# Patient Record
Sex: Female | Born: 1945
Health system: Southern US, Community
[De-identification: ages and names within clinical notes are randomized; demographics above are authoritative.]

## PROBLEM LIST (undated history)

## (undated) DIAGNOSIS — E559 Vitamin D deficiency, unspecified: Secondary | ICD-10-CM

## (undated) DIAGNOSIS — B019 Varicella without complication: Secondary | ICD-10-CM

## (undated) DIAGNOSIS — N390 Urinary tract infection, site not specified: Secondary | ICD-10-CM

## (undated) DIAGNOSIS — F339 Major depressive disorder, recurrent, unspecified: Secondary | ICD-10-CM

## (undated) DIAGNOSIS — F419 Anxiety disorder, unspecified: Secondary | ICD-10-CM

## (undated) DIAGNOSIS — Z9289 Personal history of other medical treatment: Secondary | ICD-10-CM

## (undated) DIAGNOSIS — R51 Headache: Secondary | ICD-10-CM

## (undated) DIAGNOSIS — R739 Hyperglycemia, unspecified: Secondary | ICD-10-CM

## (undated) DIAGNOSIS — K635 Polyp of colon: Secondary | ICD-10-CM

## (undated) HISTORY — DX: Polyp of colon: K63.5

## (undated) HISTORY — DX: Varicella without complication: B01.9

## (undated) HISTORY — PX: BREAST IMPLANT REMOVAL: SUR1101

## (undated) HISTORY — DX: Personal history of other medical treatment: Z92.89

## (undated) HISTORY — DX: Headache: R51

## (undated) HISTORY — PX: AUGMENTATION MAMMAPLASTY: SUR837

## (undated) HISTORY — DX: Major depressive disorder, recurrent, unspecified: F33.9

## (undated) HISTORY — PX: EXCISION MORTON'S NEUROMA: SHX5013

## (undated) HISTORY — DX: Vitamin D deficiency, unspecified: E55.9

## (undated) HISTORY — DX: Urinary tract infection, site not specified: N39.0

## (undated) HISTORY — DX: Anxiety disorder, unspecified: F41.9

## (undated) HISTORY — DX: Hyperglycemia, unspecified: R73.9

## (undated) HISTORY — PX: BUNIONECTOMY: SHX129

## (undated) HISTORY — PX: BREAST ENHANCEMENT SURGERY: SHX7

---

## 1999-01-24 ENCOUNTER — Other Ambulatory Visit: Admission: RE | Admit: 1999-01-24 | Discharge: 1999-01-24 | Payer: Self-pay | Admitting: Obstetrics & Gynecology

## 1999-11-28 HISTORY — PX: TOTAL ABDOMINAL HYSTERECTOMY: SHX209

## 2000-03-26 ENCOUNTER — Other Ambulatory Visit: Admission: RE | Admit: 2000-03-26 | Discharge: 2000-03-26 | Payer: Self-pay | Admitting: Obstetrics & Gynecology

## 2001-04-15 ENCOUNTER — Other Ambulatory Visit: Admission: RE | Admit: 2001-04-15 | Discharge: 2001-04-15 | Payer: Self-pay | Admitting: Obstetrics & Gynecology

## 2002-09-23 ENCOUNTER — Other Ambulatory Visit: Admission: RE | Admit: 2002-09-23 | Discharge: 2002-09-23 | Payer: Self-pay | Admitting: Obstetrics & Gynecology

## 2002-10-21 ENCOUNTER — Other Ambulatory Visit: Admission: RE | Admit: 2002-10-21 | Discharge: 2002-10-21 | Payer: Self-pay | Admitting: Obstetrics & Gynecology

## 2003-03-30 ENCOUNTER — Other Ambulatory Visit: Admission: RE | Admit: 2003-03-30 | Discharge: 2003-03-30 | Payer: Self-pay | Admitting: Obstetrics & Gynecology

## 2003-07-06 ENCOUNTER — Other Ambulatory Visit: Admission: RE | Admit: 2003-07-06 | Discharge: 2003-07-06 | Payer: Self-pay | Admitting: Obstetrics & Gynecology

## 2004-01-07 ENCOUNTER — Other Ambulatory Visit: Admission: RE | Admit: 2004-01-07 | Discharge: 2004-01-07 | Payer: Self-pay | Admitting: Obstetrics & Gynecology

## 2005-03-23 ENCOUNTER — Other Ambulatory Visit: Admission: RE | Admit: 2005-03-23 | Discharge: 2005-03-23 | Payer: Self-pay | Admitting: Obstetrics & Gynecology

## 2013-05-27 LAB — HM MAMMOGRAPHY: HM Mammogram: NORMAL

## 2013-05-27 LAB — HM PAP SMEAR: HM Pap smear: NORMAL

## 2013-10-06 ENCOUNTER — Ambulatory Visit: Payer: Self-pay | Admitting: Family Medicine

## 2013-12-17 ENCOUNTER — Ambulatory Visit (INDEPENDENT_AMBULATORY_CARE_PROVIDER_SITE_OTHER): Payer: Medicare HMO | Admitting: Family Medicine

## 2013-12-17 ENCOUNTER — Encounter: Payer: Self-pay | Admitting: Family Medicine

## 2013-12-17 VITALS — BP 110/80 | Temp 97.9°F | Ht 65.5 in | Wt 139.0 lb

## 2013-12-17 DIAGNOSIS — L309 Dermatitis, unspecified: Secondary | ICD-10-CM

## 2013-12-17 DIAGNOSIS — N951 Menopausal and female climacteric states: Secondary | ICD-10-CM

## 2013-12-17 DIAGNOSIS — L259 Unspecified contact dermatitis, unspecified cause: Secondary | ICD-10-CM

## 2013-12-17 DIAGNOSIS — R232 Flushing: Secondary | ICD-10-CM

## 2013-12-17 DIAGNOSIS — Q825 Congenital non-neoplastic nevus: Secondary | ICD-10-CM

## 2013-12-17 DIAGNOSIS — G4700 Insomnia, unspecified: Secondary | ICD-10-CM

## 2013-12-17 DIAGNOSIS — Z23 Encounter for immunization: Secondary | ICD-10-CM

## 2013-12-17 DIAGNOSIS — F411 Generalized anxiety disorder: Secondary | ICD-10-CM

## 2013-12-17 DIAGNOSIS — I781 Nevus, non-neoplastic: Secondary | ICD-10-CM

## 2013-12-17 MED ORDER — VENLAFAXINE HCL ER 37.5 MG PO CP24: 37.5000 mg | ORAL_CAPSULE | Freq: Every day | ORAL | Status: DC

## 2013-12-17 NOTE — Addendum Note (Signed)
Addended by: Colleen Can on: 12/17/2013 12:37 PM   Modules accepted: Orders

## 2013-12-17 NOTE — Patient Instructions (Signed)
-  We placed a referral for you as discussed to the dermatologist. It usually takes about 1-2 weeks to process and schedule this referral. If you have not heard from [place] regarding this appointment in 2 weeks please contact our office.  -As we discussed, we have prescribed a new medication for you at this appointment. We discussed the common and serious potential adverse effects of this medication and you can review these and more with the pharmacist when you pick up your medication.  Please follow the instructions for use carefully and notify us immediately if you have any problems taking this medication.  For Sleep: -keep room cool, dark quiet -wake up at same time every day -if tossing and turning for >15 minutes --> get out of bed, journal thoughts and do quiet activity then return to bed --> repeat as needed -wean off of the xanax -consider counseling  Follow up in 1 month

## 2013-12-17 NOTE — Progress Notes (Signed)
Pre visit review using our clinic review tool, if applicable. No additional management support is needed unless otherwise documented below in the visit note. 

## 2013-12-17 NOTE — Progress Notes (Signed)
Chief Complaint  Patient presents with  . Establish Care    HPI:  Destiny Robbins is here to establish care.  Last PCP and physical: sees Destiny Robbins at [facility] - had labs at last physical  Has the following chronic problems and concerns today:  Dermatitis on face: -itchy scaly rash on face -sees Dr. Allyson Robbins, but needs a referral due to insurance -has been treated with steroid for this in the past - but this did not work  Insomnia: -1/2 tab xanax on a regular basis - 3 times per day - but wants to stop this -has trouble falling asleep - can't stop worrying, does worry quite a bit on a regular basis -just started exercising this week -has trouble maintaining sleep, hot flashes - used to be on HRT, taking estroven -problems with mild depression chronically - no SI, but did have remote overdose  There are no active problems to display for this patient.  Health Maintenance: -wants pneumonia and flu vaccine  ROS: See pertinent positives and negatives per HPI.  Past Medical History  Diagnosis Date  . Chicken pox   . Colon polyp   . UTI (urinary tract infection)   . Anxiety     History reviewed. No pertinent family history.  History   Social History  . Marital Status: Unknown    Spouse Name: N/A    Number of Children: N/A  . Years of Education: N/A   Social History Main Topics  . Smoking status: Never Smoker   . Smokeless tobacco: None  . Alcohol Use: Yes     Comment: occ; about 4 times per year   . Drug Use: None  . Sexual Activity: None   Other Topics Concern  . 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      Spiritual Beliefs: Non-denominational, christian      Lifestyle: starting to exercise; healthy diet      HCPOA: daughter Destiny Robbins [phone]; has living will              Current outpatient prescriptions:ALPRAZolam (XANAX) 1 MG tablet, Take 1 mg by mouth at bedtime., Disp:  , Rfl: ;  Calcium Carbonate (CALCIUM 600 PO), Take by mouth 2 (two) times daily., Disp: , Rfl: ;  docusate sodium (COLACE) 100 MG capsule, Take 100 mg by mouth 2 (two) times daily., Disp: , Rfl: ;  fluticasone (CUTIVATE) 0.005 % ointment, Apply 1 application topically 2 (two) times daily., Disp: , Rfl:  Magnesium 400 MG CAPS, Take by mouth daily., Disp: , Rfl: ;  Nutritional Supplements (ESTROVEN MAXIMUM STRENGTH PO), Take by mouth daily., Disp: , Rfl: ;  Omega-3 Fatty Acids (FISH OIL PO), Take by mouth daily., Disp: , Rfl: ;  venlafaxine XR (EFFEXOR XR) 37.5 MG 24 hr capsule, Take 1 capsule (37.5 mg total) by mouth daily., Disp: 30 capsule, Rfl: 2;  vitamin B-12 (CYANOCOBALAMIN) 500 MCG tablet, Take 500 mcg by mouth daily., Disp: , Rfl:   EXAM:  Filed Vitals:   12/17/13 1114  BP: 110/80  Temp: 97.9 F (36.6 C)    Body mass index is 22.77 kg/(m^2).  GENERAL: vitals reviewed and listed above, alert, oriented, appears well hydrated and in no acute distress  HEENT: atraumatic, conjunttiva clear, no obvious abnormalities on inspection of external nose and ears  NECK: no obvious masses on inspection  LUNGS: clear to auscultation bilaterally, no wheezes, rales  or rhonchi, good air movement  CV: HRRR, no peripheral edema  MS: moves all extremities without noticeable abnormality  PSYCH: pleasant and cooperative, no obvious depression or anxiety  ASSESSMENT AND PLAN:  Discussed the following assessment and plan:  Facial dermatitis - Plan: Ambulatory referral to Dermatology  Spider veins - Plan: Ambulatory referral to Dermatology  Vascular nevus - Plan: Ambulatory referral to Dermatology  Hot flashes - Plan: DISCONTINUED: venlafaxine XR (EFFEXOR XR) 37.5 MG 24 hr capsule  GAD (generalized anxiety disorder) - Plan: DISCONTINUED: venlafaxine XR (EFFEXOR XR) 37.5 MG 24 hr capsule  Insomnia - Plan: DISCONTINUED: venlafaxine XR (EFFEXOR XR) 37.5 MG 24 hr capsule  -We reviewed the PMH,  PSH, FH, SH, Meds and Allergies. -We provided refills for any medications we will prescribe as needed. -We addressed current concerns per orders and patient instructions. -We have asked for records for pertinent exams, studies, vaccines and notes from previous providers. -We have advised patient to follow up per instructions below. -discussed tx options for anxiety and hot flashes and sleep issues and will do sleep hygeine, wean off xanax and trial fo venlafaxine after discussion risks/benefits -follow up 1 month -flu and pneumo givne today ->45 minutes spent face to face counseling this patient  -Patient advised to return or notify a doctor immediately if symptoms worsen or persist or new concerns arise.  Patient Instructions  -We placed a referral for you as discussed to the dermatologist. It usually takes about 1-2 weeks to process and schedule this referral. If you have not heard from [place] regarding this appointment in 2 weeks please contact our office.  -As we discussed, we have prescribed a new medication for you at this appointment. We discussed the common and serious potential adverse effects of this medication and you can review these and more with the pharmacist when you pick up your medication.  Please follow the instructions for use carefully and notify us immediately if you have any problems taking this medication.  For Sleep: -keep room cool, dark quiet -wake up at same time every day -if tossing and turning for >15 minutes --> get out of bed, journal thoughts and do quiet activity then return to bed --> repeat as needed -wean off of the xanax -consider counseling  Follow up in 1 month       Destiny Robbins, Destiny Robbins

## 2013-12-22 ENCOUNTER — Telehealth: Payer: Self-pay | Admitting: Family Medicine

## 2013-12-22 NOTE — Telephone Encounter (Signed)
[phone] (home) [phone] (work)  Feels like medication makes her feel numb. Gyn told her she needs to stop the HRT. Wonders if something else. Discussed other options. She will give this a little more time and let us know.

## 2013-12-22 NOTE — Telephone Encounter (Signed)
Message copied by Lucretia Kern on Mon Dec 22, 2013  1:03 PM ------      Message from: Miles Costain T      Created: Mon Dec 22, 2013 11:28 AM      Regarding: FW: triage pt       Pt said Effexor has helped with the hot flashes but she cant sleep on this medication.  It is keeping her awake.  She also complained of feeling no joy and feeling depressed.  Believes it to be the medicaiton.  She would like to change her prescription.  Please advise.  Thanks!!      ----- Message -----         From: Scherrie Gerlach         Sent: 12/22/2013  11:14 AM           To: Hulda Humphrey, CMA      Subject: triage pt                                                Pt states she would like something for hot flashes only, not anything for depression or for sleep       ------

## 2014-01-20 ENCOUNTER — Ambulatory Visit: Payer: Medicare HMO | Admitting: Family Medicine

## 2014-01-27 NOTE — Progress Notes (Signed)
Received office notes from [facility] from 2.25.2015.  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  Sent to scan.

## 2014-02-26 ENCOUNTER — Telehealth: Payer: Self-pay

## 2014-02-26 DIAGNOSIS — M79672 Pain in left foot: Principal | ICD-10-CM

## 2014-02-26 DIAGNOSIS — M79671 Pain in right foot: Secondary | ICD-10-CM

## 2014-02-26 NOTE — Telephone Encounter (Signed)
Pt called requesting  a referral to [facility] - pt said  Dr Donata Duff is the physician and he will be working with her on her feet ///  Phone number; [phone]

## 2014-02-26 NOTE — Telephone Encounter (Signed)
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  Would usually advise an appointment to establish diagnosis and find out what is going on before referral made to ensure PT is the best management.

## 2014-03-02 NOTE — Telephone Encounter (Signed)
Left a message for return call.  

## 2014-03-02 NOTE — Telephone Encounter (Signed)
Spoke with pt and pt states she has trouble with the balls of her feet- she uses orthodics and has been to the chiropractor and it was not beneficial.  Pt was at the gym and spoke with Dr. Humberto Leep and pt was given a free consultation and was given exercises.  Pt was told she would benefit with one or two more visits.  Ok per Dr. Maudie Mercury to place referral

## 2014-03-19 ENCOUNTER — Telehealth: Payer: Self-pay | Admitting: Family Medicine

## 2014-03-19 DIAGNOSIS — M79673 Pain in unspecified foot: Secondary | ICD-10-CM

## 2014-03-19 NOTE — Telephone Encounter (Signed)
Pt order needs to be faxed to Donata Duff fax# [phone] at [place]. Patient has already had two appointments and the office has not received any of the paperwork as of yet. Patient needs a referral for Chrystie Nose. Doran Durand, MD at [facility] at [phone] in regards to her foot.

## 2014-03-19 NOTE — Telephone Encounter (Signed)
Please have physical therapist fax [place] orders to sign. Please let her know referral to ortho sent.

## 2014-03-20 NOTE — Telephone Encounter (Signed)
Pt.notified

## 2014-05-05 ENCOUNTER — Encounter: Payer: Self-pay | Admitting: *Deleted

## 2014-06-08 ENCOUNTER — Telehealth: Payer: Self-pay | Admitting: Family Medicine

## 2014-06-08 NOTE — Telephone Encounter (Signed)
I advise appt to discuss and talk about options. Ropinirole is typically used to treat parkinsons and RLS, not sleep.

## 2014-06-08 NOTE — Telephone Encounter (Signed)
Pt states she has discussed w/ dr Maudie Mercury about her sleepless nites, and pt would like to know if dr kim would rx ropinirole.  Pt has a friend w/ the same symptoms and it has worked for him. Pt will come in should she needs to. Pharm: [facility]

## 2014-06-08 NOTE — Telephone Encounter (Signed)
I left a detailed message on the pts cell number with the information below and asked that she call back to schedule an appt.

## 2014-06-09 ENCOUNTER — Ambulatory Visit (INDEPENDENT_AMBULATORY_CARE_PROVIDER_SITE_OTHER): Payer: Medicare HMO | Admitting: Family Medicine

## 2014-06-09 ENCOUNTER — Encounter: Payer: Self-pay | Admitting: Family Medicine

## 2014-06-09 VITALS — BP 108/62 | HR 83 | Temp 97.8°F | Ht 65.5 in | Wt 134.0 lb

## 2014-06-09 DIAGNOSIS — R232 Flushing: Secondary | ICD-10-CM

## 2014-06-09 DIAGNOSIS — N951 Menopausal and female climacteric states: Secondary | ICD-10-CM

## 2014-06-09 DIAGNOSIS — F411 Generalized anxiety disorder: Secondary | ICD-10-CM

## 2014-06-09 DIAGNOSIS — G4700 Insomnia, unspecified: Secondary | ICD-10-CM

## 2014-06-09 MED ORDER — PAROXETINE HCL 10 MG PO TABS: 10.0000 mg | ORAL_TABLET | Freq: Every day | ORAL | Status: DC

## 2014-06-09 NOTE — Progress Notes (Signed)
Pre visit review using our clinic review tool, if applicable. No additional management support is needed unless otherwise documented below in the visit note. 

## 2014-06-09 NOTE — Progress Notes (Signed)
No chief complaint on file.   HPI:  Acute visit for:  GAD/Insomnia/hot flashes: -recent new pt to me, on xanax in the past - did not want to take -last visit tried effexor for hot flashes (gyn stopped her hormones) and mood disorder for a few days but she didn't like the way it made her feel  -she still has hot flashes -she can't get mind to stop thinking -not getting counseling and not interested in this -not exercising -uses the xanax -no depression or thoughts of self harm, no apneic spells or rls symptoms  ROS: See pertinent positives and negatives per HPI.  Past Medical History  Diagnosis Date  . Chicken pox   . Colon polyp   . UTI (urinary tract infection)   . Anxiety     Past Surgical History  Procedure Laterality Date  . Total abdominal hysterectomy  2001  . Excision morton's neuroma    . Breast enhancement surgery    . Breast implant removal      No family history on file.  History   Social History  . Marital Status: Unknown    Spouse Name: N/A    Number of Children: N/A  . Years of Education: N/A   Social History Main Topics  . Smoking status: Never Smoker   . Smokeless tobacco: None  . Alcohol Use: Yes     Comment: occ; about 4 times per year   . Drug Use: None  . Sexual Activity: None   Other Topics Concern  . 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      Spiritual Beliefs: Non-denominational, christian      Lifestyle: starting to exercise; healthy diet      HCPOA: daughter Jeanet Lupe [phone]; has living will              Current outpatient prescriptions:ALPRAZolam (XANAX) 1 MG tablet, Take 1 mg by mouth at bedtime., Disp: , Rfl: ;  Calcium Carbonate (CALCIUM 600 PO), Take by mouth 2 (two) times daily., Disp: , Rfl: ;  docusate sodium (COLACE) 100 MG capsule, Take 100 mg by mouth 2 (two) times daily., Disp: , Rfl: ;  fluticasone (CUTIVATE) 0.005 % ointment, Apply 1  application topically 2 (two) times daily., Disp: , Rfl:  folic acid (FOLVITE) 1 MG tablet, Take 1 mg by mouth daily., Disp: , Rfl: ;  Magnesium 400 MG CAPS, Take by mouth daily., Disp: , Rfl: ;  NON FORMULARY, Estravera, Disp: , Rfl: ;  Nutritional Supplements (ESTROVEN MAXIMUM STRENGTH PO), Take by mouth daily., Disp: , Rfl: ;  Omega-3 Fatty Acids (FISH OIL PO), Take by mouth daily., Disp: , Rfl: ;  tretinoin (RETIN-A) 0.05 % cream, Apply topically at bedtime., Disp: , Rfl:  venlafaxine XR (EFFEXOR XR) 37.5 MG 24 hr capsule, Take 1 capsule (37.5 mg total) by mouth daily with breakfast., Disp: 30 capsule, Rfl: 2;  vitamin B-12 (CYANOCOBALAMIN) 500 MCG tablet, Take 500 mcg by mouth daily., Disp: , Rfl: ;  PARoxetine (PAXIL) 10 MG tablet, Take 1 tablet (10 mg total) by mouth daily., Disp: 30 tablet, Rfl: 3  EXAM:  Filed Vitals:   06/09/14 1327  BP: 108/62  Pulse: 83  Temp: 97.8 F (36.6 C)    Body mass index is 21.95 kg/(m^2).  GENERAL: vitals reviewed and listed above, alert, oriented, appears well hydrated and in no acute distress  HEENT: atraumatic, conjunttiva clear,  no obvious abnormalities on inspection of external nose and ears  NECK: no obvious masses on inspection  LUNGS: clear to auscultation bilaterally, no wheezes, rales or rhonchi, good air movement  CV: HRRR, no peripheral edema  MS: moves all extremities without noticeable abnormality  PSYCH: pleasant and cooperative, no obvious depression or anxiety  ASSESSMENT AND PLAN:  Discussed the following assessment and plan:  Generalized anxiety disorder - Plan: PARoxetine (PAXIL) 10 MG tablet  Hot flashes - Plan: PARoxetine (PAXIL) 10 MG tablet  Insomnia - Plan: PARoxetine (PAXIL) 10 MG tablet  -we discussed various options and risks for the treatment of GAD, hot flashes and sleep - the sleep issues seem to stem from the GAD and hot flashes and she opted to try paroxetine for this -it seems she has tried a number of  medication for GAD over hte years but is sensitive to medications and worried about side effects -she only tried the effexor for a few days -opted to try paroxetine and start at a very low dose and gradually titrate -Patient advised to return or notify a doctor immediately if symptoms worsen or persist or new concerns arise.  Patient Instructions  -start paxil 10mg  once daily at 8pm, in two weeks increase to 20mg  (two tablet) daily at [place], STRESS or DEPRESSION:  []  consider counseling - this is as effective as medications and will help to get at the root of the imbalance.   []  Ensure AT LEAST 150 minutes of cardiovascular exercise per week - 30 minutes of sweaty exercise daily is best.  []  Set a schedule that includes adequate time for sleep, fun activities and exercise.  []  Medications are best used short term while finding a healthier more balanced life that promotes good emotional and mental health. I do not prescribe sleep medications such as Ambien, etc. or controlled anxiety medications such as Xanax, Klonopin, etc. long term in adult patients and will have you see a psychiatrist if these types of medications are required on more then a temporary basis.  FOR IMPROVED SLEEP AND TO RESET YOUR SLEEP SCHEDULE: []  exercise 30 minutes daily  []  go to bed and wake up at the same time  []  keep bedroom cool, dark and quiet  []  reserve bed for sleep - do not read, watch TV, etc in bed  []  If you toss and turn more then 15-20 minutes get out of bed and list thoughts/do quite activity then go back to bed; repeat as needed; do not worry about when you eventually fall asleep - still get up at the same time and turn on lights and take shower  [] get counseling  []  some people find that a half dose of benadryl, melatonin, tylenol pm or unisom on a few nights per week is helpful initially for a few weeks  [] seek help and treat any depression or anxiety  [] prescription  strength sleep medications should only be used in severe cases of insomnia if other measures fail and should be used sparingly   Follow in about 4-6 weeks         [name], HANNAH R.

## 2014-06-09 NOTE — Patient Instructions (Signed)
-  start paxil 10mg  once daily at 8pm, in two weeks increase to 20mg  (two tablet) daily at [place], STRESS or DEPRESSION:  []  consider counseling - this is as effective as medications and will help to get at the root of the imbalance.   []  Ensure AT LEAST 150 minutes of cardiovascular exercise per week - 30 minutes of sweaty exercise daily is best.  []  Set a schedule that includes adequate time for sleep, fun activities and exercise.  []  Medications are best used short term while finding a healthier more balanced life that promotes good emotional and mental health. I do not prescribe sleep medications such as Ambien, etc. or controlled anxiety medications such as Xanax, Klonopin, etc. long term in adult patients and will have you see a psychiatrist if these types of medications are required on more then a temporary basis.  FOR IMPROVED SLEEP AND TO RESET YOUR SLEEP SCHEDULE: []  exercise 30 minutes daily  []  go to bed and wake up at the same time  []  keep bedroom cool, dark and quiet  []  reserve bed for sleep - do not read, watch TV, etc in bed  []  If you toss and turn more then 15-20 minutes get out of bed and list thoughts/do quite activity then go back to bed; repeat as needed; do not worry about when you eventually fall asleep - still get up at the same time and turn on lights and take shower  [] get counseling  []  some people find that a half dose of benadryl, melatonin, tylenol pm or unisom on a few nights per week is helpful initially for a few weeks  [] seek help and treat any depression or anxiety  [] prescription strength sleep medications should only be used in severe cases of insomnia if other measures fail and should be used sparingly   Follow in about 4-6 weeks

## 2014-07-13 ENCOUNTER — Ambulatory Visit: Payer: Medicare HMO | Admitting: Family Medicine

## 2014-09-16 ENCOUNTER — Ambulatory Visit
Admission: RE | Admit: 2014-09-16 | Discharge: 2014-09-16 | Disposition: A | Discharge status: Home / Self Care | Payer: Commercial Managed Care - HMO | Source: Ambulatory Visit | Attending: Family Medicine | Admitting: Family Medicine

## 2014-09-16 ENCOUNTER — Encounter: Payer: Self-pay | Admitting: Family Medicine

## 2014-09-16 ENCOUNTER — Ambulatory Visit (INDEPENDENT_AMBULATORY_CARE_PROVIDER_SITE_OTHER): Payer: Commercial Managed Care - HMO | Admitting: Family Medicine

## 2014-09-16 VITALS — BP 122/86 | HR 88 | Temp 97.8°F | Ht 65.5 in | Wt 137.5 lb

## 2014-09-16 DIAGNOSIS — R519 Headache, unspecified: Secondary | ICD-10-CM

## 2014-09-16 DIAGNOSIS — G43819 Other migraine, intractable, without status migrainosus: Secondary | ICD-10-CM

## 2014-09-16 DIAGNOSIS — J320 Chronic maxillary sinusitis: Secondary | ICD-10-CM

## 2014-09-16 DIAGNOSIS — R51 Headache: Secondary | ICD-10-CM

## 2014-09-16 LAB — BASIC METABOLIC PANEL
BUN: 11 mg/dL (ref 6–23)
CALCIUM: 10.3 mg/dL (ref 8.4–10.5)
CO2: 26 meq/L (ref 19–32)
Chloride: 104 mEq/L (ref 96–112)
Creatinine, Ser: 0.9 mg/dL (ref 0.4–1.2)
GFR: 71.12 mL/min (ref >60.00)
GLUCOSE: 78 mg/dL (ref 70–99)
Potassium: 4.4 mEq/L (ref 3.5–5.1)
Sodium: 142 mEq/L (ref 135–145)

## 2014-09-16 LAB — CBC WITH DIFFERENTIAL/PLATELET
BASOS ABS: 0.1 10*3/uL (ref 0.0–0.1)
Basophils Relative: 0.5 % (ref 0.0–3.0)
EOS ABS: 0.4 10*3/uL (ref 0.0–0.7)
Eosinophils Relative: 3.4 % (ref 0.0–5.0)
HCT: 39.5 % (ref 36.0–46.0)
Hemoglobin: 13 g/dL (ref 12.0–15.0)
LYMPHS PCT: 34.5 % (ref 12.0–46.0)
Lymphs Abs: 4.2 10*3/uL — ABNORMAL HIGH (ref 0.7–4.0)
MCHC: 33 g/dL (ref 30.0–36.0)
MCV: 93.4 fl (ref 78.0–100.0)
MONO ABS: 0.6 10*3/uL (ref 0.1–1.0)
Monocytes Relative: 5.2 % (ref 3.0–12.0)
NEUTROS PCT: 56.4 % (ref 43.0–77.0)
Neutro Abs: 6.8 10*3/uL (ref 1.4–7.7)
PLATELETS: 313 10*3/uL (ref 150.0–400.0)
RBC: 4.23 Mil/uL (ref 3.87–5.11)
RDW: 13.2 % (ref 11.5–15.5)
WBC: 12.1 10*3/uL — ABNORMAL HIGH (ref 4.0–10.5)

## 2014-09-16 LAB — SEDIMENTATION RATE: Sed Rate: 36 mm/hr — ABNORMAL HIGH (ref 0–22)

## 2014-09-16 MED ORDER — SUMATRIPTAN SUCCINATE 50 MG PO TABS: 50.0000 mg | ORAL_TABLET | ORAL | Status: DC | PRN

## 2014-09-16 MED ORDER — AMOXICILLIN-POT CLAVULANATE 875-125 MG PO TABS: 1.0000 | ORAL_TABLET | Freq: Two times a day (BID) | ORAL | Status: DC

## 2014-09-16 NOTE — Progress Notes (Signed)
Pre visit review using our clinic review tool, if applicable. No additional management support is needed unless otherwise documented below in the visit note. 

## 2014-09-16 NOTE — Progress Notes (Signed)
No chief complaint on file.   HPI:  Sinus issues/Headaches: -long history of intermittent migraine headaches her whole life - (usually stomach upset, severe headache on R side of head putting her in bed in the past - but had not had these the last few years) -for three weeks has had nasal congestion, PND, tickle in throat, maxillary sinus pain and pressure -1 week ago had headache that started with upset stomach, nausea and then rapid onset of severe headache on the R side of her head from the neck to the temporal area with sensitivity to light and sound. Better with OTC meds.  -had 3 in a row and went to ED in [place] and they thought it was migraine - had blood tests negative and treated with iv pain medication and resolved -continues to have the sinus issues -wants CT of the head -denies: fevers, chills, vomiting, diarrhea, vision changes, weakness or numbness -report normal optho exam with opthomologist in last few weeks    ROS: See pertinent positives and negatives per HPI.  Past Medical History  Diagnosis Date  . Chicken pox   . Colon polyp   . UTI (urinary tract infection)   . Anxiety     Past Surgical History  Procedure Laterality Date  . Total abdominal hysterectomy  2001  . Excision morton's neuroma    . Breast enhancement surgery    . Breast implant removal      No family history on file.  History   Social History  . Marital Status: Unknown    Spouse Name: N/A    Number of Children: N/A  . Years of Education: N/A   Social History Main Topics  . Smoking status: Never Smoker   . Smokeless tobacco: None  . Alcohol Use: Yes     Comment: occ; about 4 times per year   . Drug Use: None  . Sexual Activity: None   Other Topics Concern  . 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      Spiritual Beliefs: Non-denominational, christian      Lifestyle: starting to exercise; healthy diet      HCPOA:  daughter Francina Beery [phone]; has living will              Current outpatient prescriptions:ALPRAZolam (XANAX) 1 MG tablet, Take 1 mg by mouth at bedtime., Disp: , Rfl: ;  Calcium Carbonate (CALCIUM 600 PO), Take by mouth 2 (two) times daily., Disp: , Rfl: ;  docusate sodium (COLACE) 100 MG capsule, Take 100 mg by mouth 2 (two) times daily., Disp: , Rfl: ;  fluticasone (CUTIVATE) 0.005 % ointment, Apply 1 application topically 2 (two) times daily., Disp: , Rfl:  folic acid (FOLVITE) 1 MG tablet, Take 1 mg by mouth daily., Disp: , Rfl: ;  Magnesium 400 MG CAPS, Take by mouth daily., Disp: , Rfl: ;  NON FORMULARY, Estravera, Disp: , Rfl: ;  Nutritional Supplements (ESTROVEN MAXIMUM STRENGTH PO), Take by mouth daily., Disp: , Rfl: ;  tretinoin (RETIN-A) 0.05 % cream, Apply topically at bedtime., Disp: , Rfl: ;  vitamin B-12 (CYANOCOBALAMIN) 500 MCG tablet, Take 500 mcg by mouth daily., Disp: , Rfl:  amoxicillin-clavulanate (AUGMENTIN) 875-125 MG per tablet, Take 1 tablet by mouth 2 (two) times daily., Disp: 20 tablet, Rfl: 0;  SUMAtriptan (IMITREX) 50 MG tablet, Take 1 tablet (50 mg total) by mouth every 2 (two) hours as needed  for migraine or headache. May repeat in 2 hours if headache persists or recurs., Disp: 10 tablet, Rfl: 0  EXAM:  Filed Vitals:   09/16/14 1109  BP: 122/86  Pulse: 88  Temp: 97.8 F (36.6 C)    Body mass index is 22.53 kg/(m^2).  GENERAL: vitals reviewed and listed above, alert, oriented, appears well hydrated and in no acute distress  HEENT: atraumatic, PERRLA, visual acuity grossly intact, conjunttiva clear, no obvious abnormalities on inspection of external nose and ears, normal appearance of ear canals and TMs, clear nasal congestion, mild post oropharyngeal erythema with PND, no tonsillar edema or exudate, no sinus TTP, no temp artery TTP  NECK: no obvious masses on inspection, no carotid bruit  LUNGS: clear to auscultation bilaterally, no wheezes, rales or  rhonchi, good air movement  CV: HRRR, no peripheral edema  MS: moves all extremities without noticeable abnormality  PSYCH: pleasant and cooperative, no obvious depression or anxiety  NEURO: CN II-XII grossly intact, finger to nose normal, strength and sensation normal throughout, no nystagmus, no pronator drift  ASSESSMENT AND PLAN:  Discussed the following assessment and plan:  Other migraine without status migrainosus, intractable - Plan: Basic metabolic panel, CBC with Differential, CT Head W Wo Contrast, SUMAtriptan (IMITREX) 50 MG tablet  Worsening headaches - Plan: CT Head W Wo Contrast, Sedimentation Rate  Maxillary sinusitis, unspecified chronicity - Plan: amoxicillin-clavulanate (AUGMENTIN) 875-125 MG per tablet  -we discussed possible serious (stroke, aneurysm, dissection, tumor, meningitis, arteritis, other)  and likely etiologies, workup and treatment, treatment risks and return precautions - likely migraine triggered by viral or bacterial rhinosinusitis  -after this discussion, [employer] opted for treatment of potential sinusitis with abx, trail of migraine medication as this is most like cause of her headaches last week, CT scan after discussion risks/benefits other options -follow up advised in 1 month or sooner if needed, emergency precautions discussed -of course, we advised [place]  to return or notify a doctor immediately if symptoms worsen or persist or new concerns arise.  -Patient advised to return or notify a doctor immediately if symptoms worsen or persist or new concerns arise.  Patient Instructions  -We have ordered labs or studies at this visit. It can take up to 1-2 weeks for results and processing. We will contact you with instructions IF your results are abnormal. Normal results will be released to your [facility]. If you have not heard from [place] or can not find your results in [facility] in 2 weeks please contact our office.  -take the antibiotic as  instructed  -avoid frequent use of over the counter pain medications  -try taking on pill of the migraine medication (sumatriptan) at the first signs of a migraine. Can repeat in 2 hours - do not take more then 2 in 24 hours.  -seek care immediately if worsening          [name], HANNAH R.

## 2014-09-16 NOTE — Patient Instructions (Signed)
-  We have ordered labs or studies at this visit. It can take up to 1-2 weeks for results and processing. We will contact you with instructions IF your results are abnormal. Normal results will be released to your [facility]. If you have not heard from [place] or can not find your results in [facility] in 2 weeks please contact our office.  -take the antibiotic as instructed  -avoid frequent use of over the counter pain medications  -try taking on pill of the migraine medication (sumatriptan) at the first signs of a migraine. Can repeat in 2 hours - do not take more then 2 in 24 hours.  -seek care immediately if worsening

## 2014-09-17 ENCOUNTER — Telehealth: Payer: Self-pay | Admitting: Family Medicine

## 2014-09-17 DIAGNOSIS — B373 Candidiasis of vulva and vagina: Secondary | ICD-10-CM

## 2014-09-17 DIAGNOSIS — B3731 Acute candidiasis of vulva and vagina: Secondary | ICD-10-CM

## 2014-09-17 DIAGNOSIS — R51 Headache: Principal | ICD-10-CM

## 2014-09-17 DIAGNOSIS — R519 Headache, unspecified: Secondary | ICD-10-CM

## 2014-09-17 MED ORDER — FLUCONAZOLE 150 MG PO TABS: 150.0000 mg | ORAL_TABLET | Freq: Once | ORAL | Status: DC

## 2014-09-17 NOTE — Telephone Encounter (Signed)
Pt called said she was returning a call

## 2014-09-17 NOTE — Telephone Encounter (Addendum)
Discussed everything with the patient. CT ok, mild ESR and WBC elevation. She is feeling much better today, but still with mild HA,  and opted for eval with neurologist rather then ENT. She prefers to see them before she leaves for trip next week. I will place urgent referral but advised not sure if they could see her that quickly. Wants diflucan as reports always get yeast infection with abx. Advised return and emergent precautions.

## 2014-09-18 ENCOUNTER — Encounter: Payer: Self-pay | Admitting: Family Medicine

## 2014-09-18 ENCOUNTER — Telehealth: Payer: Self-pay | Admitting: Family Medicine

## 2014-09-18 ENCOUNTER — Ambulatory Visit (INDEPENDENT_AMBULATORY_CARE_PROVIDER_SITE_OTHER): Payer: Commercial Managed Care - HMO | Admitting: Family Medicine

## 2014-09-18 VITALS — BP 128/70 | HR 90 | Temp 97.8°F | Ht 65.5 in | Wt 138.0 lb

## 2014-09-18 DIAGNOSIS — R21 Rash and other nonspecific skin eruption: Secondary | ICD-10-CM

## 2014-09-18 DIAGNOSIS — R0981 Nasal congestion: Secondary | ICD-10-CM

## 2014-09-18 DIAGNOSIS — R519 Headache, unspecified: Secondary | ICD-10-CM

## 2014-09-18 DIAGNOSIS — R51 Headache: Secondary | ICD-10-CM

## 2014-09-18 NOTE — Telephone Encounter (Signed)
noted 

## 2014-09-18 NOTE — Progress Notes (Signed)
Pre visit review using our clinic review tool, if applicable. No additional management support is needed unless otherwise documented below in the visit note. 

## 2014-09-18 NOTE — Telephone Encounter (Signed)
Patient Information:  Caller Name: Christin  Phone: [phone]  Patient: Destiny Robbins F  Gender: Female  DOB: [birth year]  Age: 68 Years  PCP: Maudie Mercury (TEXT 1st, after 20 mins can call), [facility])  Office Follow Up:  Does the office need to follow up with this patient?: No  Instructions For The Office: N/A  RN Note:  Mentioned she took a Diflucan 09/17/14 for vaginal yeast infection. Used topical steroid 09/16/14 for facial dermatitis that resolved. Took 4th dose of generic Augmentin at 0700.  Facial rash noted about 0900 with redness like a sunburn.  Denies welts, itching, or blotchy rash.  In last hour, right side of face now is less red .  Advised to see MD today per nursing judgement since new onset local while taking antibiotic for mild localized rash per Localized Rash.  Symptoms  Reason For Call & Symptoms: Facial redness, like a sunburn, while taking Augmentin for sinus infection diagnosed 09/16/14.  No itching or hives.  Reviewed Health History In EMR: Yes  Reviewed Medications In EMR: Yes  Reviewed Allergies In EMR: Yes  Reviewed Surgeries / Procedures: Yes  Date of Onset of Symptoms: 09/18/2014  Guideline(s) Used:  Rash or Redness - Localized  Disposition Per Guideline:   Home Care  Reason For Disposition Reached:   Mild localized rash  Advice Given:  Avoid Soap:  Wash the area once thoroughly with soap to remove any remaining irritants. Thereafter avoid soaps to this area. Cleanse the area when needed with warm water.  Avoid Soap:  Wash the area once thoroughly with soap to remove any remaining irritants. Thereafter avoid soaps to this area. Cleanse the area when needed with warm water.  Apply Cold to the Area:  Apply or soak in cold water for 20 minutes every 3 to 4 hours to reduce itching or pain.  Hydrocortisone Cream for Itching:   Available over-the-counter in [place] as 0.5% and 1% cream.  Expected Course:  Most of these rashes pass in 2  to 3 days.  Call Back If:  Rash spreads or becomes worse  Rash lasts longer than 1 week  You become worse.  RN Overrode Recommendation:  Make Appointment  Per nursing judgment.  Appointment Scheduled:  09/18/2014 15:15:00 Appointment Scheduled Provider:  Maudie Mercury (TEXT 1st, after 20 mins can call), [facility])

## 2014-09-18 NOTE — Progress Notes (Signed)
No chief complaint on file.   HPI:  Acute visit for:  Rash on face: -after starting abx for URI and Diflucan - started suddenly -red, mildly warm and slightly irritated but no painful rash on both sides of face that is now receding -headaches and sinus are improving -denies: fevers, malaise, nausea, vomiting, diarrhea, joint pains, rash anywhere else  ROS: See pertinent positives and negatives per HPI.  Past Medical History  Diagnosis Date  . Chicken pox   . Colon polyp   . UTI (urinary tract infection)   . Anxiety     Past Surgical History  Procedure Laterality Date  . Total abdominal hysterectomy  2001  . Excision morton's neuroma    . Breast enhancement surgery    . Breast implant removal      No family history on file.  History   Social History  . Marital Status: Unknown    Spouse Name: N/A    Number of Children: N/A  . Years of Education: N/A   Social History Main Topics  . Smoking status: Never Smoker   . Smokeless tobacco: None  . Alcohol Use: Yes     Comment: occ; about 4 times per year   . Drug Use: None  . Sexual Activity: None   Other Topics Concern  . 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      Spiritual Beliefs: Non-denominational, christian      Lifestyle: starting to exercise; healthy diet      HCPOA: daughter Jaelyne Deeg [phone]; has living will              Current outpatient prescriptions:ALPRAZolam (XANAX) 1 MG tablet, Take 1 mg by mouth at bedtime., Disp: , Rfl: ;  amoxicillin-clavulanate (AUGMENTIN) 875-125 MG per tablet, Take 1 tablet by mouth 2 (two) times daily., Disp: 20 tablet, Rfl: 0;  Calcium Carbonate (CALCIUM 600 PO), Take by mouth 2 (two) times daily., Disp: , Rfl: ;  docusate sodium (COLACE) 100 MG capsule, Take 100 mg by mouth 2 (two) times daily., Disp: , Rfl:  fluconazole (DIFLUCAN) 150 MG tablet, Take 1 tablet (150 mg total) by mouth once., Disp: 1  tablet, Rfl: 0;  fluticasone (CUTIVATE) 0.005 % ointment, Apply 1 application topically 2 (two) times daily., Disp: , Rfl: ;  folic acid (FOLVITE) 1 MG tablet, Take 1 mg by mouth daily., Disp: , Rfl: ;  Magnesium 400 MG CAPS, Take by mouth daily., Disp: , Rfl: ;  NON FORMULARY, Estravera, Disp: , Rfl:  Nutritional Supplements (ESTROVEN MAXIMUM STRENGTH PO), Take by mouth daily., Disp: , Rfl: ;  SUMAtriptan (IMITREX) 50 MG tablet, Take 1 tablet (50 mg total) by mouth every 2 (two) hours as needed for migraine or headache. May repeat in 2 hours if headache persists or recurs., Disp: 10 tablet, Rfl: 0;  tretinoin (RETIN-A) 0.05 % cream, Apply topically at bedtime., Disp: , Rfl:  vitamin B-12 (CYANOCOBALAMIN) 500 MCG tablet, Take 500 mcg by mouth daily., Disp: , Rfl:   EXAM:  Filed Vitals:   09/18/14 1516  BP: 128/70  Pulse: 90  Temp: 97.8 F (36.6 C)    Body mass index is 22.61 kg/(m^2).  GENERAL: vitals reviewed and listed above, alert, oriented, appears well hydrated and in no acute distress  HEENT: atraumatic, conjunttiva clear, no obvious abnormalities on inspection of external nose and ears, patch of erythema on both cheeks only  NECK:  no obvious masses on inspection  LUNGS: clear to auscultation bilaterally, no wheezes, rales or rhonchi, good air movement  CV: HRRR, no peripheral edema  MS: moves all extremities without noticeable abnormality  PSYCH: pleasant and cooperative, no obvious depression or anxiety  ASSESSMENT AND PLAN:  Discussed the following assessment and plan:  Rash and nonspecific skin eruption  -unsure of etiology - query viral, not typical findings for drug rash but possible -opted for monitoring, return and emergent precautions given resolving -Patient advised to return or notify a doctor immediately if symptoms worsen or persist or new concerns arise.  There are no Patient Instructions on file for this visit.   Colin Benton R.

## 2014-10-26 ENCOUNTER — Encounter: Payer: Self-pay | Admitting: Family Medicine

## 2014-10-26 ENCOUNTER — Ambulatory Visit (INDEPENDENT_AMBULATORY_CARE_PROVIDER_SITE_OTHER): Payer: Commercial Managed Care - HMO | Admitting: Family Medicine

## 2014-10-26 VITALS — BP 116/78 | HR 63 | Temp 98.1°F | Ht 65.5 in | Wt 138.6 lb

## 2014-10-26 DIAGNOSIS — J309 Allergic rhinitis, unspecified: Secondary | ICD-10-CM

## 2014-10-26 DIAGNOSIS — R51 Headache: Secondary | ICD-10-CM

## 2014-10-26 DIAGNOSIS — G43809 Other migraine, not intractable, without status migrainosus: Secondary | ICD-10-CM

## 2014-10-26 DIAGNOSIS — R519 Headache, unspecified: Secondary | ICD-10-CM

## 2014-10-26 NOTE — Progress Notes (Signed)
Pre visit review using our clinic review tool, if applicable. No additional management support is needed unless otherwise documented below in the visit note. 

## 2014-10-26 NOTE — Patient Instructions (Addendum)
BEFORE YOU LEAVE: -schedule your medicare wellness exam  Please consider counseling and call the number provided  Please try nasocort daily for one month for allergy symptoms  See the neurologist tomorrow and please recheck labs then if doing blood work. If blood work not done - please let me know so that we can recheck your blood counts.

## 2014-10-26 NOTE — Progress Notes (Signed)
HPI:  Follow up:    Headaches: -hx migraines, son has migraines -change in HAs in October, CT ok, tx for sinusitis at the time and developed rash on face, labs with very mildly elevated wbc and sed rate -reports: still has headaches and seeing neurologist tomorrow for this - but has not had any more severe headaches -feels good today -has stopped analgesics -feels they are related to allergy issues (sneezing, PND) and has a cold the last week - now improving -denies: vision changes, fevers, weakness, numbness  Depression: -her whole life -not worse -reports has tried every medication and does not want to take medications -denies: SI or thoughts of self harm -she has not done counseling   ROS: See pertinent positives and negatives per HPI.  Past Medical History  Diagnosis Date  . Chicken pox   . Colon polyp   . UTI (urinary tract infection)   . Anxiety     Past Surgical History  Procedure Laterality Date  . Total abdominal hysterectomy  2001  . Excision morton's neuroma    . Breast enhancement surgery    . Breast implant removal      No family history on file.  History   Social History  . Marital Status: Unknown    Spouse Name: N/A    Number of Children: N/A  . Years of Education: N/A   Social History Main Topics  . Smoking status: Never Smoker   . Smokeless tobacco: None  . Alcohol Use: Yes     Comment: occ; about 4 times per year   . Drug Use: None  . Sexual Activity: None   Other Topics Concern  . 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 , Disp: , Rfl: ;  docusate sodium (COLACE) 100 MG capsule, Take 100 mg by mouth 2 (two) times daily., Disp: , Rfl: ;  fluticasone (CUTIVATE) 0.005 % ointment, Apply 1 application topically 2 (two) times daily., Disp: , Rfl:  Magnesium 400 MG CAPS, Take by mouth daily., Disp: , Rfl: ;  NON FORMULARY, Estravera, Disp: , Rfl: ;  Nutritional Supplements (ESTROVEN MAXIMUM STRENGTH PO), Take by mouth daily., Disp: , Rfl: ;  SUMAtriptan (IMITREX) 50 MG tablet, Take 1 tablet (50 mg total) by mouth every 2 (two) hours as needed for migraine or headache. May repeat in 2 hours if headache persists or recurs., Disp: 10 tablet, Rfl: 0 tretinoin (RETIN-A) 0.05 % cream, Apply topically at bedtime., Disp: , Rfl: ;  vitamin B-12 (CYANOCOBALAMIN) 500 MCG tablet, Take 500 mcg by mouth daily., Disp: , Rfl:   EXAM:  Filed Vitals:   10/26/14 0907  BP: 116/78  Pulse: 63  Temp: 98.1 F (36.7 C)    Body mass index is 22.71 kg/(m^2).  GENERAL: vitals reviewed and listed above, alert, oriented, appears well hydrated and in no acute distress  HEENT: atraumatic, conjunttiva clear, no obvious abnormalities on inspection of external nose and ears  NECK: no obvious masses on  inspection  LUNGS: clear to auscultation bilaterally, no wheezes, rales or rhonchi, good air movement  CV: HRRR, no peripheral edema  MS: moves all extremities without noticeable abnormality  PSYCH: pleasant and cooperative, no obvious depression or anxiety  ASSESSMENT AND PLAN:  Discussed the following assessment and plan:  Frequent headaches Other migraine without status migrainosus, not intractable -eval with neurology tomorrow, advised repeating ESR and CBC today but she opted to see if neuro could repeat tomorrow in case ordering other labs, advised if this is not repeated there to call so that we can check here  Allergic rhinitis: -trial INS   Depression: -discussed options, she does not want to do medications -advised  counseling (CBT) and number provided to cal  -Patient advised to return or notify a doctor immediately if symptoms worsen or persist or new concerns arise.  Patient Instructions  BEFORE YOU LEAVE: -schedule your medicare wellness exam  Please consider counseling and call the number provided  Please try nasocort daily for one month for allergy symptoms  See the neurologist tomorrow and please recheck labs then if doing blood work. If blood work not done - please let me know so that we can recheck your blood counts.       Colin Benton R.

## 2014-10-27 ENCOUNTER — Encounter: Payer: Self-pay | Admitting: Neurology

## 2014-10-27 ENCOUNTER — Other Ambulatory Visit: Payer: Self-pay | Admitting: Neurology

## 2014-10-27 ENCOUNTER — Ambulatory Visit (INDEPENDENT_AMBULATORY_CARE_PROVIDER_SITE_OTHER): Payer: Commercial Managed Care - HMO | Admitting: Neurology

## 2014-10-27 VITALS — BP 130/70 | HR 94 | Resp 16 | Ht 65.75 in | Wt 136.0 lb

## 2014-10-27 DIAGNOSIS — F32A Depression, unspecified: Secondary | ICD-10-CM

## 2014-10-27 DIAGNOSIS — R519 Headache, unspecified: Secondary | ICD-10-CM

## 2014-10-27 DIAGNOSIS — F329 Major depressive disorder, single episode, unspecified: Secondary | ICD-10-CM

## 2014-10-27 DIAGNOSIS — R51 Headache: Secondary | ICD-10-CM

## 2014-10-27 LAB — CBC
HCT: 40.4 % (ref 36.0–46.0)
HEMOGLOBIN: 13.1 g/dL (ref 12.0–15.0)
MCH: 29.8 pg (ref 26.0–34.0)
MCHC: 32.4 g/dL (ref 30.0–36.0)
MCV: 92 fL (ref 78.0–100.0)
MPV: 10.4 fL (ref 9.4–12.4)
Platelets: 313 10*3/uL (ref 150–400)
RBC: 4.39 MIL/uL (ref 3.87–5.11)
RDW: 13.2 % (ref 11.5–15.5)
WBC: 8.7 10*3/uL (ref 4.0–10.5)

## 2014-10-27 LAB — C-REACTIVE PROTEIN: CRP: <0.5 mg/dL (ref <0.60)

## 2014-10-27 MED ORDER — NORTRIPTYLINE HCL 10 MG PO CAPS: ORAL_CAPSULE | ORAL | Status: DC

## 2014-10-27 NOTE — Patient Instructions (Signed)
1. Start nortriptyline 10mg : Take 1 capsule at bedtime for 1 week, then increase to 2 capsules at bedtime 2. Keep a calendar of your headaches 3. Bloodwork for CBC, ESR, CRP 4. Follow-up in 6 weeks

## 2014-10-27 NOTE — Progress Notes (Signed)
NEUROLOGY CONSULTATION NOTE  MUSETTE Robbins MRN: [id number] DOB: [birth year]  Referring provider: Dr. Colin Robbins Primary care provider: Dr. Colin Robbins  Reason for consult:  headaches  Dear Dr Destiny Robbins:  Thank you for your kind referral of Destiny Robbins for consultation of the above symptoms. Although her history is well known to you, please allow me to reiterate it for the purpose of our medical record.Records and images were personally reviewed where available.  HISTORY OF PRESENT ILLNESS: This is a pleasant 68 year old right-handed woman with a history of headaches since childhood. She reports that headaches in the past would be a constant nagging pain lasting all day, over right occipital region radiating up. Advil would usually help. Over the past several years, these had become infrequent. At the beginning of October, she started having sinus pressure, as well as almost daily headaches in the right hemisphere. She would take sinus medication and Advil daily until she was instructed to stop them on her PCP visit 3 weeks later. She was also started on antibiotics which made her feel better. She is concerned about a change in her headaches mid-October while she was visiting [place]. She had 3 separate episodes of severe sharp pain in the right neck region radiating up to the temporal region lasting 30 minutes, followed by a sensation of feeling bruised for several hours. She had been taking Advil and went to the hospital in [place]. She had one last milder episode last 09/22/14 which resolved with Advil. She reports that these headaches would be preceded by clenching in her stomach. She would have to go to bed with the heating pad. There is no associated nausea, vomiting, visual obscurations. She denies any diplopia, tinnitus, focal numbness/tingling/weakness. No jaw claudication. She denies any neck or back pain, no bowel/bladder dysfunction. She occasionally takes Xanax once a week for sleep, but  reports that sleep is "awful." She feels like she wakes up every hour, and hot flashes keep her up as well. She has had several head injuries over the years with no loss of consciousness. Her son has headaches. She reports "bad depression" but depression medications such as Effexor had worsened her headaches.  Laboratory Data 09/16/14: CBC showed WBC of 12.1, otherwise normal. ESR 36  I personally reviewed head CT with and without contrast done 09/16/14 which was normal, no evidence of mass, bleed, or aneurysm.  PAST MEDICAL HISTORY: Past Medical History  Diagnosis Date  . Chicken pox   . Colon polyp   . UTI (urinary tract infection)   . Anxiety     PAST SURGICAL HISTORY: Past Surgical History  Procedure Laterality Date  . Total abdominal hysterectomy  2001  . Excision morton's neuroma    . Breast enhancement surgery    . Breast implant removal      MEDICATIONS: Current Outpatient Prescriptions on File Prior to Visit  Medication Sig Dispense Refill  . ALPRAZolam (XANAX) 1 MG tablet Take 1 mg by mouth at bedtime as needed.     . Calcium Carbonate (CALCIUM 600 PO) Take by mouth daily.     Marland Kitchen docusate sodium (COLACE) 100 MG capsule Take 100 mg by mouth 2 (two) times daily.    . fluticasone (CUTIVATE) 0.005 % ointment Apply 1 application topically 2 (two) times daily.    . Magnesium 400 MG CAPS Take by mouth daily.    . NON FORMULARY Estravera    . Nutritional Supplements (ESTROVEN MAXIMUM STRENGTH PO) Take by mouth daily.    Marland Kitchen  vitamin B-12 (CYANOCOBALAMIN) 500 MCG tablet Take 500 mcg by mouth daily.     No current facility-administered medications on file prior to visit.    ALLERGIES: Allergies  Allergen Reactions  . Vicodin [Hydrocodone-Acetaminophen] Rash    FAMILY HISTORY: No family history on file.  SOCIAL HISTORY: History   Social History  . Marital Status: Unknown    Spouse Name: N/A    Number of Children: N/A  . Years of Education: N/A   Occupational  History  . Not on file.   Social History Main Topics  . Smoking status: Never Smoker   . Smokeless tobacco: Not on file  . Alcohol Use: 0.0 oz/week    0 Not specified per week     Comment: occ; about 4 times per year   . Drug Use: No  . Sexual Activity: Not on file   Other Topics Concern  . 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      Spiritual Beliefs: Non-denominational, christian      Lifestyle: starting to exercise; healthy diet      HCPOA: daughter Otilia Kareem [phone]; has living will              REVIEW OF SYSTEMS: Constitutional: No fevers, chills, or sweats, no generalized fatigue, change in appetite Eyes: No visual changes, double vision, eye pain Ear, nose and throat: No hearing loss, ear pain, nasal congestion, sore throat Cardiovascular: No chest pain, palpitations Respiratory:  No shortness of breath at rest or with exertion, wheezes GastrointestinaI: No nausea, vomiting, diarrhea, abdominal pain, fecal incontinence Genitourinary:  No dysuria, urinary retention or frequency Musculoskeletal:  No neck pain, back pain Integumentary: No rash, pruritus, skin lesions Neurological: as above Psychiatric: No depression, insomnia, anxiety Endocrine: No palpitations, fatigue, diaphoresis, mood swings, change in appetite, change in weight, increased thirst Hematologic/Lymphatic:  No anemia, purpura, petechiae. Allergic/Immunologic: no itchy/runny eyes, nasal congestion, recent allergic reactions, rashes  PHYSICAL EXAM: Filed Vitals:   10/27/14 1250  BP: 130/70  Pulse: 94  Resp: 16   General: No acute distress Head:  Normocephalic/atraumatic, no temporal tenderness or ropiness Eyes: Fundoscopic exam shows bilateral sharp discs, no vessel changes, exudates, or hemorrhages Neck: supple, no paraspinal tenderness, full range of motion Back: No paraspinal tenderness Heart: regular rate and  rhythm Lungs: Clear to auscultation bilaterally. Vascular: No carotid bruits. Skin/Extremities: No rash, no edema Neurological Exam: Mental status: alert and oriented to person, place, and time, no dysarthria or aphasia, Fund of knowledge is appropriate.  Recent and remote memory are intact.  Attention and concentration are normal.    Able to name objects and repeat phrases. Cranial nerves: CN I: not tested CN II: pupils equal, round and reactive to light, visual fields intact, fundi unremarkable. CN III, IV, VI:  full range of motion, no nystagmus, no ptosis CN V: facial sensation intact CN VII: upper and lower face symmetric CN VIII: hearing intact to finger rub CN IX, X: gag intact, uvula midline CN XI: sternocleidomastoid and trapezius muscles intact CN XII: tongue midline Bulk & Tone: normal, no fasciculations. Motor: 5/5 throughout with no pronator drift. Sensation: intact to light touch, cold, pin, vibration and joint position sense.  No extinction to double simultaneous stimulation.  Romberg test negative Deep Tendon Reflexes: +2 throughout, no ankle clonus Plantar responses: downgoing bilaterally Cerebellar: no incoordination on finger to nose, heel to shin. No dysdiadochokinesia Gait:  narrow-based and steady, able to tandem walk adequately. Tremor: none  IMPRESSION: This is a pleasant 68 year old right-handed woman with a history of headaches since childhood that changed in frequency and quality last October. Her neurological exam and brain imaging are normal. ESR was mildly elevated in October, no temporal artery tenderness or other signs of temporal arteritis, however since this is a change in headaches, ESR and CRP,as well as CBC will be reordered. Headaches possibly due to poor sleep, with more severe headache episodes potentially due to medication overuse. She has stopped intake of prn pain medications and has not had the severe headaches since October. She would benefit from  starting a daily headache preventative medication that will hopefully help with insomnia as well. Nortriptyline low dose will be started with uptitration as tolerated. Side effects were discussed. She will keep a headache calendar and follow-up in 6-8 weeks.  Thank you for allowing me to participate in the care of this patient. Please do not hesitate to call for any questions or concerns.   Ellouise Newer, M.D.  CC: Dr. Maudie Robbins

## 2014-10-28 ENCOUNTER — Encounter: Payer: Self-pay | Admitting: Neurology

## 2014-10-28 DIAGNOSIS — R519 Headache, unspecified: Secondary | ICD-10-CM | POA: Insufficient documentation

## 2014-10-28 DIAGNOSIS — F339 Major depressive disorder, recurrent, unspecified: Secondary | ICD-10-CM

## 2014-10-28 DIAGNOSIS — R51 Headache: Principal | ICD-10-CM

## 2014-10-28 HISTORY — DX: Major depressive disorder, recurrent, unspecified: F33.9

## 2014-10-28 HISTORY — DX: Headache, unspecified: R51.9

## 2014-10-28 LAB — SEDIMENTATION RATE: Sed Rate: 27 mm/hr — ABNORMAL HIGH (ref 0–22)

## 2014-10-29 ENCOUNTER — Ambulatory Visit: Payer: Commercial Managed Care - HMO | Admitting: Neurology

## 2014-11-09 ENCOUNTER — Telehealth: Payer: Self-pay | Admitting: *Deleted

## 2014-11-09 NOTE — Telephone Encounter (Signed)
Patient cancelled follow up she has not stared taking her medication yet will call to reschedule later

## 2014-12-09 ENCOUNTER — Encounter: Payer: Self-pay | Admitting: Family Medicine

## 2014-12-09 ENCOUNTER — Ambulatory Visit (INDEPENDENT_AMBULATORY_CARE_PROVIDER_SITE_OTHER): Payer: Commercial Managed Care - HMO | Admitting: Family Medicine

## 2014-12-09 VITALS — BP 100/72 | HR 102 | Temp 98.0°F | Ht 65.75 in | Wt 135.3 lb

## 2014-12-09 DIAGNOSIS — Z23 Encounter for immunization: Secondary | ICD-10-CM

## 2014-12-09 DIAGNOSIS — Z0000 Encounter for general adult medical examination without abnormal findings: Secondary | ICD-10-CM

## 2014-12-09 NOTE — Addendum Note (Signed)
Addended by: Agnes Lawrence on: 12/09/2014 09:40 AM   Modules accepted: Orders

## 2014-12-09 NOTE — Progress Notes (Signed)
Medicare Annual Preventive Care Visit  (initial annual wellness or annual wellness exam)  Concerns and/or follow up today:  GAD/Insomnia: -tried effexor and paxil in the past - did not follow up -tried xanax in the past -reports: not taking paxil, thinks caused diarrhea - doing ok off medication -denies: depression, SI, symptoms worsening  HAs: -seeing neurologist -worsened in 2015, neg imaging and seeing neuro -nortriptyline started in 2015 -reports: not taking the nortriptyline, resolved HAs -denies: worsening headaches  AR: -uses flonase   ROS: negative for report of fevers, unintentional weight loss, vision changes, vision loss, hearing loss or change, chest pain, sob, hemoptysis, melena, hematochezia, hematuria, genital discharge or lesions, falls, bleeding or bruising, loc, thoughts of suicide or self harm, memory loss  1.) Patient-completed health risk assessment  - completed and reviewed, see scanned documentation  2.) Review of Medical History: -PMH, PSH, Family History and current specialty and care providers reviewed and updated and listed below  - see scanned in document in chart and below  Past Medical History  Diagnosis Date  . Chicken pox   . Colon polyp   . UTI (urinary tract infection)   . Anxiety     Past Surgical History  Procedure Laterality Date  . Total abdominal hysterectomy  2001  . Excision morton's neuroma    . Breast enhancement surgery    . Breast implant removal      History   Social History  . Marital Status: Unknown    Spouse Name: N/A    Number of Children: N/A  . Years of Education: N/A   Occupational History  . Not on file.   Social History Main Topics  . Smoking status: Never Smoker   . Smokeless tobacco: Not on file  . Alcohol Use: 0.0 oz/week    0 Not specified per week     Comment: occ; about 4 times per year   . Drug Use: No  . Sexual Activity: Not on file   Other Topics Concern  . 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  NO  History of falling? NO  Any trouble with IADLs - using a phone, using transportation, grocery shopping, preparing meals, doing housework, doing laundry, taking medications and managing money?  NO  Advance Directives? YES  See summary of recommendations in Patient Instructions below.  4.) Physical Exam Filed Vitals:   12/09/14 0857  BP: 100/72  Pulse: 102  Temp: 98 F (36.7 C)   Estimated body mass index is 22.01 kg/(m^2) as calculated from the following:   Height as of this encounter: 5' 5.75" (1.67 m).   Weight as of this encounter: 135 lb 4.8 oz (61.372 kg).  EKG (optional): deferred  General: alert, appear well hydrated and in no acute distress  HEENT: visual acuity grossly intact  CV: HRRR  Lungs: CTA bilaterally  Psych: pleasant and cooperative, no obvious depression or anxiety  Cognitive function grossly intact  See patient instructions for recommendations.  Education and counseling regarding the above  review of health provided with a plan for the following: -see scanned patient completed form for further details -fall prevention strategies discussed  -healthy lifestyle discussed -importance and resources for completing advanced directives discussed -see patient instructions below for any other recommendations provided  4)The following written screening schedule of preventive measures were reviewed with assessment and plan made per below, orders and patient instructions:      AAA screeningn/a     Alcohol screening done     Obesity Screening and counseling done      STI screening n/a     Tobacco Screening done       Pneumococcal (PPSV23 -one dose after 64, one before if risk factors), influenza yearly and hepatitis B vaccines (if high risk - end stage renal disease, IV drugs, homosexual men, live in home for mentally retarded, hemophilia receiving factors) ASSESSMENT/PLAN: pneumococcal 60 today      Screening mammograph (yearly if >40) ASSESSMENT/PLAN: gets mammograms every 2 years      Screening Pap smear/pelvic exam (q2 years) ASSESSMENT/PLAN: n/a      Prostate cancer screening ASSESSMENT/PLAN: n/a      Colorectal cancer screening (FOBT yearly or flex sig q4y or colonoscopy q10y or barium enema q4y) ASSESSMENT/PLAN: declined      Diabetes outpatient self-management training services ASSESSMENT/PLAN: n/a      Bone mass measurements(covered q2y if indicated - estrogen def, osteoporosis, hyperparathyroid, vertebral abnormalities, osteoporosis or steroids) ASSESSMENT/PLAN: she wants wants to check on insurance for this before doing and reports would never treat with medications regardless of severity of bone loss.      Screening for glaucoma(q1y if high risk - diabetes, FH, AA and > 50 or hispanic and > 65) ASSESSMENT/PLAN: sees optho      Medical nutritional therapy for individuals with diabetes or renal disease ASSESSMENT/PLAN: n/a      Cardiovascular screening blood tests (lipids q5y) ASSESSMENT/PLAN: refused      Diabetes screening tests ASSESSMENT/PLAN: refused   7.) Summary: -risk factors and conditions per above assessment were discussed and treatment, recommendations and referrals were offered per documentation above and orders and patient instructions.  Initial Medicare annual wellness visit  Patient Instructions  Before your leave: -pneumococcal 23  We recommend the following healthy lifestyle measures: - eat a healthy diet consisting of lots of vegetables, fruits, beans, nuts, seeds, healthy meats such as white chicken  and fish and whole grains.  - avoid fried foods, fast food, processed foods, sodas, red meet and other fattening foods.  - get a least 150 minutes of aerobic exercise per week.   -schedule mammogram  -lets [place] know if you decide to do the bone density test

## 2014-12-09 NOTE — Progress Notes (Signed)
Pre visit review using our clinic review tool, if applicable. No additional management support is needed unless otherwise documented below in the visit note. 

## 2014-12-09 NOTE — Progress Notes (Signed)
HCPOA molly Blizard (daughter) - pt reports she is aware of her wishes. Cell [phone].

## 2014-12-09 NOTE — Patient Instructions (Signed)
Before your leave: -pneumococcal 23  We recommend the following healthy lifestyle measures: - eat a healthy diet consisting of lots of vegetables, fruits, beans, nuts, seeds, healthy meats such as white chicken and fish and whole grains.  - avoid fried foods, fast food, processed foods, sodas, red meet and other fattening foods.  - get a least 150 minutes of aerobic exercise per week.   -schedule mammogram  -lets [place] know if you decide to do the bone density test

## 2014-12-16 ENCOUNTER — Ambulatory Visit: Payer: Commercial Managed Care - HMO | Admitting: Neurology

## 2015-01-27 ENCOUNTER — Telehealth: Payer: Self-pay | Admitting: Family Medicine

## 2015-01-27 DIAGNOSIS — F411 Generalized anxiety disorder: Secondary | ICD-10-CM

## 2015-01-27 NOTE — Telephone Encounter (Signed)
Can you give her number to cal and see if referral needed for insurance and place if so? Thank you!

## 2015-01-27 NOTE — Telephone Encounter (Signed)
Pt called to ask for a referral to see [facility]

## 2015-01-27 NOTE — Telephone Encounter (Signed)
Per Neoma Laming a referral is needed and this was placed and I called the pt and informed her of this.  She was given the phone number to call and schedule an appt.

## 2015-02-02 ENCOUNTER — Ambulatory Visit (INDEPENDENT_AMBULATORY_CARE_PROVIDER_SITE_OTHER): Payer: Medicare HMO | Admitting: Licensed Clinical Social Worker

## 2015-02-02 DIAGNOSIS — F4323 Adjustment disorder with mixed anxiety and depressed mood: Secondary | ICD-10-CM

## 2015-02-09 ENCOUNTER — Ambulatory Visit (INDEPENDENT_AMBULATORY_CARE_PROVIDER_SITE_OTHER): Payer: Commercial Managed Care - HMO | Admitting: Licensed Clinical Social Worker

## 2015-02-09 DIAGNOSIS — F4323 Adjustment disorder with mixed anxiety and depressed mood: Secondary | ICD-10-CM

## 2015-03-04 ENCOUNTER — Telehealth: Payer: Self-pay | Admitting: Family Medicine

## 2015-03-04 DIAGNOSIS — M79672 Pain in left foot: Principal | ICD-10-CM

## 2015-03-04 DIAGNOSIS — M79671 Pain in right foot: Secondary | ICD-10-CM

## 2015-03-04 NOTE — Telephone Encounter (Signed)
Ok. Can refer for foot pain.

## 2015-03-04 NOTE — Telephone Encounter (Signed)
Pt has trouble with ball of her feet and would like to know if dr Maudie Mercury will refer her to  [facility] dr Curt Bears [place]. Pt has [employer] medicare ins. Can we refer?

## 2015-03-05 NOTE — Telephone Encounter (Signed)
I called the pt and informed her the referral was entered and someone will call her with appt information.

## 2015-03-24 ENCOUNTER — Ambulatory Visit: Payer: Self-pay

## 2015-03-24 ENCOUNTER — Ambulatory Visit (INDEPENDENT_AMBULATORY_CARE_PROVIDER_SITE_OTHER): Payer: Commercial Managed Care - HMO | Admitting: Podiatrist

## 2015-03-24 ENCOUNTER — Ambulatory Visit: Payer: Commercial Managed Care - HMO

## 2015-03-24 ENCOUNTER — Encounter: Payer: Self-pay | Admitting: Podiatrist

## 2015-03-24 VITALS — BP 125/78 | HR 80 | Resp 15

## 2015-03-24 DIAGNOSIS — M79672 Pain in left foot: Secondary | ICD-10-CM

## 2015-03-24 DIAGNOSIS — M79671 Pain in right foot: Secondary | ICD-10-CM | POA: Diagnosis not present

## 2015-03-24 DIAGNOSIS — M7742 Metatarsalgia, left foot: Secondary | ICD-10-CM | POA: Diagnosis not present

## 2015-03-24 DIAGNOSIS — M7741 Metatarsalgia, right foot: Secondary | ICD-10-CM

## 2015-03-24 NOTE — Progress Notes (Signed)
   Subjective:    Patient ID: Destiny Robbins, female    DOB: 1946/06/19, 69 y.o.   MRN: [id number]  HPI Pt presents with bilateral pain in balls of both feet, worsens with prolonged walking, states she has tried otc inserts and supportive   Review of Systems  All other systems reviewed and are negative.      Objective:   Physical Exam GENERAL APPEARANCE: Alert, conversant. Appropriately groomed. No acute distress.  VASCULAR: Pedal pulses palpable at 2/4 DP and PT bilateral.  Capillary refill time is immediate to all digits,  Proximal to distal cooling it warm to warm.  Digital hair growth is present bilateral  NEUROLOGIC: sensation is intact epicritically and protectively to 5.07 monofilament at 5/5 sites bilateral.  Light touch is intact bilateral, vibratory sensation intact bilateral, achilles tendon reflex is intact bilateral.  MUSCULOSKELETAL: acceptable muscle strength, tone and stability bilateral. Mildly prominent and plantar flexed metatarsal heads are noted bilateral. 2 through 5  DERMATOLOGIC: skin color, texture, and turger are within normal limits. Fat pad atrophy noted plantar aspect of bilateral forefoot. Generalized and diffuse pain is noted in this area as well. No pre-ulcerative lesions are seen       Assessment & Plan:  Metatarsalgia bilateral  Plan: Discussed metatarsal bars to be obtained at [facility] shoe [facility] and gave the patient information for these. Also discussed custom orthotics however she's happy with her [employer] orthotics with a Dr. Felicie Morn insert cover. She relates she had custom orthotics in the past and they've not relieve her discomfort. She will continue to wear good supportive sneakers in tennis shoes. Other than that likely she will be experiencing this pain long term due to her foot type and fat pad atrophy.

## 2015-10-28 ENCOUNTER — Ambulatory Visit (INDEPENDENT_AMBULATORY_CARE_PROVIDER_SITE_OTHER)
Admission: RE | Admit: 2015-10-28 | Discharge: 2015-10-28 | Disposition: A | Discharge status: Home / Self Care | Payer: Commercial Managed Care - HMO | Source: Ambulatory Visit | Attending: Family Medicine | Admitting: Family Medicine

## 2015-10-28 ENCOUNTER — Ambulatory Visit (INDEPENDENT_AMBULATORY_CARE_PROVIDER_SITE_OTHER): Payer: Commercial Managed Care - HMO | Admitting: Family Medicine

## 2015-10-28 VITALS — BP 118/70 | Temp 98.2°F | Ht 65.75 in | Wt 137.3 lb

## 2015-10-28 DIAGNOSIS — M25561 Pain in right knee: Secondary | ICD-10-CM

## 2015-10-28 DIAGNOSIS — M25551 Pain in right hip: Secondary | ICD-10-CM

## 2015-10-28 DIAGNOSIS — M1711 Unilateral primary osteoarthritis, right knee: Secondary | ICD-10-CM | POA: Diagnosis not present

## 2015-10-28 DIAGNOSIS — Z23 Encounter for immunization: Secondary | ICD-10-CM

## 2015-10-28 NOTE — Progress Notes (Signed)
HPI:  Acute visit for:  R knee pain: -started 2-3 months ago -intermittent and mild with certain movements initially -then did exercise class a few days ago and has been worse since with sharp pain in the R knee laterally and posteriorly and in the hamstrings and IT band with squatting, going to stand and certain movements -no swelling, popping, clicking, giving away, fevers, malaise, redness, back pain or known prior injury -she reports saw orthopedic doc in the past for several years for R hip pain but reports never given a dx (Dr. Earl Lites)  ROS: See pertinent positives and negatives per HPI.  Past Medical History  Diagnosis Date  . Chicken pox   . Colon polyp   . UTI (urinary tract infection)   . Anxiety     Past Surgical History  Procedure Laterality Date  . Total abdominal hysterectomy  2001  . Excision morton's neuroma    . Breast enhancement surgery    . Breast implant removal      No family history on file.  Social History   Social History  . Marital Status: Unknown    Spouse Name: N/A  . Number of Children: N/A  . Years of Education: N/A   Social History Main Topics  . Smoking status: Never Smoker   . Smokeless tobacco: Not on file  . Alcohol Use: 0.0 oz/week    0 Standard drinks or equivalent per week     Comment: occ; about 4 times per year   . Drug Use: No  . Sexual Activity: Not on file   Other Topics Concern  . 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  EXAM:  Filed Vitals:   10/28/15 1101  BP: 118/70  Temp: 98.2 F (36.8 C)    Body mass index is 22.33 kg/(m^2).  GENERAL: vitals reviewed and listed above, alert, oriented, appears well  hydrated and in no acute distress  HEENT: atraumatic, conjunttiva clear, no obvious abnormalities on inspection of external nose and ears  NECK: no obvious masses on inspection  LUNGS: clear to auscultation bilaterally, no wheezes, rales or rhonchi, good air movement  CV: HRRR, no peripheral edema  MS: moves all extremities without noticeable abnormality except uses cane when stands, ok with walking On exam of legs and knee - no redness, swelling or appreciable deformity TTP mild in jt lines, hamstrings and IT band and over great trochanter, + pet crepitus and mild J sign, pain with squatting, neg lachman, neg drawer, neg mcmurry, -/+ tests: neg trendelenburg,-facet loading, -SLRT, -CLRT, -FABER, -FADIR Normal muscle strength, sensation to light touch and  in LEs bilaterally  PSYCH: pleasant and cooperative, she is usually somewhat anxious,no obvious depression  ASSESSMENT AND PLAN:  Discussed the following assessment and plan:  Right knee pain - Plan: DG Knee Complete 4 Views Right  Hip pain, right  Arthralgia of right lower leg  -we discussed possible serious and likely etiologies, workup and treatment, treatment risks and return precautions - seems may have combo of muscular soreness and ? OA, radicular less likely given pattern -after this discussion, Destiny Robbins opted  for plain film knee, supportive care, PT pending xray results with close follow up - discussed imaging hip, back, MRI may be needed if persistent issues -follow up advised in 4 - 6 weeks -of course, we advised [name]  to return or notify a doctor immediately if symptoms worsen or persist or new concerns arise.  .  -Patient advised to return or notify a doctor immediately if symptoms worsen or persist or new concerns arise.  Patient Instructions  BEFORE YOU LEAVE: -xray sheet -flu shot if she wishes -follow up in 4-6 weeks  Get the xray - once we get these results back we will likely place a referral for physical  therapy to strength the low back, hips and knees  For pain you can use topical menthol or capsaicin sports creams and/or tylenol or aleve per instructions as needed, these are all available over the counter     [name], [name]

## 2015-10-28 NOTE — Patient Instructions (Addendum)
BEFORE YOU LEAVE: -xray sheet -flu shot if she wishes -follow up in 4-6 weeks  Get the xray - once we get these results back we will likely place a referral for physical therapy to strength the low back, hips and knees  For pain you can use topical menthol or capsaicin sports creams and/or tylenol or aleve per instructions as needed, these are all available over the counter

## 2015-10-28 NOTE — Progress Notes (Signed)
Pre visit review using our clinic review tool, if applicable. No additional management support is needed unless otherwise documented below in the visit note. 

## 2015-10-29 ENCOUNTER — Other Ambulatory Visit: Payer: Self-pay | Admitting: *Deleted

## 2015-10-29 DIAGNOSIS — M1711 Unilateral primary osteoarthritis, right knee: Secondary | ICD-10-CM

## 2015-11-01 ENCOUNTER — Ambulatory Visit: Payer: Commercial Managed Care - HMO | Attending: Family Medicine

## 2015-11-01 DIAGNOSIS — G8929 Other chronic pain: Secondary | ICD-10-CM

## 2015-11-01 DIAGNOSIS — R269 Unspecified abnormalities of gait and mobility: Secondary | ICD-10-CM | POA: Diagnosis not present

## 2015-11-01 DIAGNOSIS — M25561 Pain in right knee: Secondary | ICD-10-CM | POA: Insufficient documentation

## 2015-11-01 DIAGNOSIS — M25651 Stiffness of right hip, not elsewhere classified: Secondary | ICD-10-CM

## 2015-11-01 NOTE — Patient Instructions (Signed)
All 3 times a day, 3 reps each, 20 second hold  HIP: Hamstrings - Short Sitting    Rest leg on raised surface. Keep knee straight. Lift chest. Hold ___ seconds. ___ reps per set, ___ sets per day, ___ days per week  Copyright  VHI. All rights reserved.    Piriformis Stretch, Sitting    Sit, one ankle on opposite knee, same-side hand on crossed knee. Push down on knee, keeping spine straight. Lean torso forward, with flat back, until tension is felt in hamstrings and gluteals of crossed-leg side. Hold ___ seconds.  Repeat ___ times per session. Do ___ sessions per day.  Copyright  VHI. All rights reserved.   Gastroc, Sitting (Passive)    Sit with strap or towel around ball of foot. Gently pull toward body. Hold ___ seconds.  Repeat ___ times per session. Do ___ sessions per day.  Achilles Tendon Stretch    Stand with hands supported on wall, elbows slightly bent, feet parallel and both heels on floor, front knee bent, back knee straight. Slowly relax back knee until a stretch is felt in achilles tendon. Hold ____ seconds. Repeat with leg positions switched.  Copyright  VHI. All rights reserved.   [facility] [address] [place], [place] [address] Phone # [phone] Fax [phone]

## 2015-11-01 NOTE — Therapy (Signed)
[facility] [address] [place], [place], [address] Phone: [phone]   Fax:  [phone]  Physical Therapy Evaluation  Patient Details  Name: Destiny Robbins MRN: [id number] Date of Birth: 03-Feb-1946 Referring Provider: Colin Benton, MD  Encounter Date: 11/01/2015      PT End of Session - 11/01/15 1222    Visit Number 1   Number of Visits 10   Date for PT Re-Evaluation 12/27/15   PT Start Time [id number]   PT Stop Time 1223   PT Time Calculation (min) 36 min   Activity Tolerance Patient tolerated treatment well   Behavior During Therapy [facility] for tasks assessed/performed      Past Medical History  Diagnosis Date  . Chicken pox   . Colon polyp   . UTI (urinary tract infection)   . Anxiety     Past Surgical History  Procedure Laterality Date  . Total abdominal hysterectomy  2001  . Excision morton's neuroma    . Breast enhancement surgery    . Breast implant removal      There were no vitals filed for this visit.  Visit Diagnosis:  Knee pain, chronic, right - Plan: PT plan of care cert/re-cert  Hip stiffness, right - Plan: PT plan of care cert/re-cert  Abnormality of gait - Plan: PT plan of care cert/re-cert      Subjective Assessment - 11/01/15 1146    Subjective Pt presents to PT with complaints of Rt knee pain that began 3 months ago. Pt notices the pain in the posterior aspect of the knee.  Pt with history of Rt foot pain (Morton's neuroma) and Rt hip pain (chronic history).  Pt exercises at the [facility] program) and reports that she had signifricant Rt knee pain s/p exercise class last week.     Pertinent History Morton's neuroma on Rt foot,    Limitations Walking;Standing   How long can you stand comfortably? 2 hours   How long can you walk comfortably? 2 hours   Diagnostic tests x-ray: OA in the Rt knee   Patient Stated Goals reduce pain, return to exercise class   Currently in Pain?  Yes   Pain Score 3    Pain Location Knee   Pain Orientation Right   Pain Descriptors / Indicators Dull   Pain Type Acute pain   Pain Onset More than a month ago   Pain Frequency Intermittent   Aggravating Factors  standing, walking, weightbearing after sitting   Pain Relieving Factors sitting            [facility] PT Assessment - 11/01/15 0001    Assessment   Medical Diagnosis Rt knee OA (M17.9)   Referring Provider Colin Benton, MD   Onset Date/Surgical Date 08/02/15   Next MD Visit 11/25/15   Precautions   Precautions None   Restrictions   Weight Bearing Restrictions No   Balance Screen   Has the patient fallen in the past 6 months No   Has the patient had a decrease in activity level because of a fear of falling?  No   Is the patient reluctant to leave their home because of a fear of falling?  No   Home Environment   Living Environment Private residence   [place] to enter   Entrance Stairs-Number of Steps 3   [place] One level   Prior Function   Level of Independence Independent  Vocation Retired   Leisure exercise Landscape architect)   Cognition   Overall Cognitive Status Within Functional Limits for tasks assessed   Observation/Other Assessments   Focus on Therapeutic Outcomes (FOTO)  51% limitation   Posture/Postural Control   Posture/Postural Control No significant limitations   ROM / Strength   AROM / PROM / Strength AROM;Strength   AROM   Overall AROM  Within functional limits for tasks performed   Overall AROM Comments full knee AROM of Rt knee with posterior knee pain at end range AROM. Hamstring flexibility limited by 25% bilaterally. Hip ER limited by 25% on the Rt vs the Lt   Strength   Overall Strength Within functional limits for tasks performed   Overall Strength Comments 4+/5 to 5/5 bilateral LE strength   Palpation   Patella mobility normal with crepitus with patellar mobs in all directions.  Pain with  superior glide of patella.     Palpation comment Tenderness over distal 1/2 of hamstring on the Rt and proximal gastroc.     Transfers   Transfers Sit to Stand;Stand to Sit   Sit to Stand 7: Independent   Ambulation/Gait   Ambulation/Gait Yes   Ambulation/Gait Assistance 7: Independent   Ambulation Distance (Feet) 100 Feet   Gait Pattern Step-through pattern;Decreased step length - right;Decreased stance time - right;Decreased dorsiflexion - right                           PT Education - 11/01/15 1217    Education provided Yes   Education Details HEP: hip and ankle flexibility   Person(s) Educated Patient   Methods Explanation;Handout;Demonstration   Comprehension Verbalized understanding;Returned demonstration          PT Short Term Goals - 11/01/15 1227    PT SHORT TERM GOAL #1   Title be independent in initial HEP   Time 4   Period Weeks   Status New   PT SHORT TERM GOAL #2   Title demonstrate symmetry with gait on level surface   Time 4   Period Weeks   Status New   PT SHORT TERM GOAL #3   Title report a 50% reduction in Rt leg pain with standing and walking in the community   Time 4   Period Weeks   Status New           PT Long Term Goals - 11/01/15 1129    PT LONG TERM GOAL #1   Title be independent in advanced HEP   Time 8   Period Weeks   Status New   PT LONG TERM GOAL #2   Title reduce FOTO to < or = to 33% limitaiton   Time 8   Period Weeks   Status New   PT LONG TERM GOAL #3   Title return to regular exercise without limitation or increased Rt LE pain   Time 8   Period Weeks   Status New   PT LONG TERM GOAL #4   Title improve Rt hip flexibility to cross leg to put on shoe/sock without limitaiton on the Rt   Time 8   Period Weeks   Status New   PT LONG TERM GOAL #5   Title report a 60% reduction in Rt LE pain with standing and walking in the community   Time 8   Period Weeks   Status New  Plan  - 11-05-15 1223    Clinical Impression Statement Pt presents to PT with Rt knee pain with 3 month history and no incident or injury.  Signifcant flare up of pain 1 weeks ago when performing exercise in class.  Pt demonstrates Rt>Lt hip stiffness, hamstring stiffness, tenderness over posterior aspect of the Rt leg and has reduced heel strike/knee extension with gait.  FOTO score is 51% limitation.  Pt will benefit from skilled PT for Rt hip, knee, ankle flexibility, gait training and pain management to allow for return to prior level of function.     Pt will benefit from skilled therapeutic intervention in order to improve on the following deficits Abnormal gait;Decreased range of motion;Difficulty walking;Pain;Impaired flexibility;Decreased activity tolerance   Rehab Potential Good   PT Frequency 2x / week   PT Duration 8 weeks   PT Treatment/Interventions ADLs/Self Care Home Management;Cryotherapy;Electrical Stimulation;Moist Heat;Therapeutic exercise;Therapeutic activities;Stair training;Gait training;Ultrasound;Neuromuscular re-education;Patient/family education;Manual techniques;Passive range of motion   PT Next Visit Plan Flexibility of Rt hip/knee/gastroc, manual to Rt hamstring/gastroc, gait training, modalities PRN   Consulted and Agree with Plan of Care Patient          G-Codes - Nov 05, 2015 1126    Functional Assessment Tool Used FOTO: 51% limitation    Functional Limitation Mobility: Walking and moving around   Mobility: Walking and Moving Around Current Status [phone]) At least 40 percent but less than 60 percent impaired, limited or restricted   Mobility: Walking and Moving Around Goal Status [phone]) At least 20 percent but less than 40 percent impaired, limited or restricted       Problem List Patient Active Problem List   Diagnosis Date Noted  . Chronic daily headache 10/28/2014  . Depression 10/28/2014    TAKACS,KELLY, PT 05-Nov-2015, 12:33 PM  [facility] [address], [place], [place], [address] Phone: [phone]   Fax:  [phone]  Name: Destiny Robbins MRN: [id number] Date of Birth: July 17, 1946

## 2015-11-04 ENCOUNTER — Ambulatory Visit: Payer: Commercial Managed Care - HMO | Admitting: Physical Therapy

## 2015-11-04 ENCOUNTER — Encounter: Payer: Self-pay | Admitting: Physical Therapy

## 2015-11-04 DIAGNOSIS — M25651 Stiffness of right hip, not elsewhere classified: Secondary | ICD-10-CM

## 2015-11-04 DIAGNOSIS — M25561 Pain in right knee: Principal | ICD-10-CM

## 2015-11-04 DIAGNOSIS — R269 Unspecified abnormalities of gait and mobility: Secondary | ICD-10-CM

## 2015-11-04 DIAGNOSIS — G8929 Other chronic pain: Secondary | ICD-10-CM | POA: Diagnosis not present

## 2015-11-04 NOTE — Therapy (Signed)
[facility] [address] [place], [place], [address] Phone: [phone]   Fax:  [phone]  Physical Therapy Treatment  Patient Details  Name: Destiny Robbins MRN: [id number] Date of Birth: 05-Aug-1946 Referring Provider: Colin Benton, MD  Encounter Date: 11/04/2015      PT End of Session - 11/04/15 0807    Visit Number 2   Number of Visits 10   Date for PT Re-Evaluation 12/27/15   PT Start Time 0759   PT Stop Time 0844   PT Time Calculation (min) 45 min   Behavior During Therapy [facility] for tasks assessed/performed      Past Medical History  Diagnosis Date  . Chicken pox   . Colon polyp   . UTI (urinary tract infection)   . Anxiety     Past Surgical History  Procedure Laterality Date  . Total abdominal hysterectomy  2001  . Excision morton's neuroma    . Breast enhancement surgery    . Breast implant removal      There were no vitals filed for this visit.  Visit Diagnosis:  Knee pain, chronic, right  Hip stiffness, right  Abnormality of gait      Subjective Assessment - 11/04/15 0803    Subjective Pt reports the stretching helps, but still has the feeling of favoring the rigth leg.    Currently in Pain? No/denies                         [facility] Adult PT Treatment/Exercise - 11/04/15 0001    Posture/Postural Control   Posture/Postural Control No significant limitations   Exercises   Exercises Knee/Hip   Knee/Hip Exercises: Stretches   Active Hamstring Stretch Both;3 reps;20 seconds   Hip Flexor Stretch Right;20 seconds  in supine, in thomasgrip, each leg   Piriformis Stretch Both;3 reps;20 seconds  in sitting very limited, practiced in supine x 3   Gastroc Stretch Both;3 reps;20 seconds   Soleus Stretch Right;3 reps;20 seconds   Knee/Hip Exercises: Aerobic   Stationary Bike L 1 x 69min                PT Education - 11/04/15 0851    Education Details iliopsoas and  figure 4 stretch in supine,    Person(s) Educated Patient   Methods Explanation;Demonstration;Handout   Comprehension Verbalized understanding;Returned demonstration          PT Short Term Goals - 11/01/15 1227    PT SHORT TERM GOAL #1   Title be independent in initial HEP   Time 4   Period Weeks   Status New   PT SHORT TERM GOAL #2   Title demonstrate symmetry with gait on level surface   Time 4   Period Weeks   Status New   PT SHORT TERM GOAL #3   Title report a 50% reduction in Rt leg pain with standing and walking in the community   Time 4   Period Weeks   Status New           PT Long Term Goals - 11/01/15 1129    PT LONG TERM GOAL #1   Title be independent in advanced HEP   Time 8   Period Weeks   Status New   PT LONG TERM GOAL #2   Title reduce FOTO to < or = to 33% limitaiton   Time 8   Period Weeks   Status New  PT LONG TERM GOAL #3   Title return to regular exercise without limitation or increased Rt LE pain   Time 8   Period Weeks   Status New   PT LONG TERM GOAL #4   Title improve Rt hip flexibility to cross leg to put on shoe/sock without limitaiton on the Rt   Time 8   Period Weeks   Status New   PT LONG TERM GOAL #5   Title report a 60% reduction in Rt LE pain with standing and walking in the community   Time 8   Period Weeks   Status New               Plan - 11/04/15 [id number]    Clinical Impression Statement Pt reports 70% improvement, but still slighly limbing gait and with certain movement a twinch in back of Rt LE. Pt will continue to benefit from skilled Pt to address tightness of hamstrings and iliopsoas, and release of tenderness posterior aspect of Rt leg.    Pt will benefit from skilled therapeutic intervention in order to improve on the following deficits Abnormal gait;Decreased range of motion;Difficulty walking;Pain;Impaired flexibility;Decreased activity tolerance   Rehab Potential Good   PT Frequency 2x / week   PT  Duration 8 weeks   PT Next Visit Plan Flexibility of Rt hip/knee/gastroc, manual to Rt hamstring/gastroc, gait training, modalities PRN   PT Home Exercise Plan current HEP   Consulted and Agree with Plan of Care Patient        Problem List Patient Active Problem List   Diagnosis Date Noted  . Chronic daily headache 10/28/2014  . Depression 10/28/2014    NAUMANN-HOUEGNIFIO,Kieren Ricci PTA 11/04/2015, 8:54 AM  [facility] [address], [place], [place], [address] Phone: [phone]   Fax:  [phone]  Name: NITASHA GUIA MRN: [id number] Date of Birth: 02/27/1946

## 2015-11-04 NOTE — Patient Instructions (Signed)
    Supporting right thigh behind knee, slowly straighten knee until stretch is felt in back of thigh. Hold ____ seconds. Repeat __3__ times per set. Do  3  sets per session. Do 2 sessions per day.  http://orth.exer.us/657   http://orth.exer.us/658   Copyright  VHI. All rights reserved.  Stretching: Hamstring (Standing)    Place right foot on stool. Slowly lean forward, keeping back straight, until stretch is felt in back of thigh. Hold 20 seconds. Repeat _3___ times per set. Do  3 sets per session. Do 2-3 sessions per day.  http://orth.exer.us/658   Copyright  VHI. All rights reserved.  Hip Flexors (Supine)    Lie with both legs bent over edge of firm surface. Make sureyour leg to be stretched hangs over. To stretch left hip, bring opposite knee to chest.  Hold 20  seconds. Repeat 3  times. Do 2-3 sessions per day. CAUTION: Stretch should be gentle, steady and slow.  Copyright  VHI. All rights reserved.

## 2015-11-09 ENCOUNTER — Other Ambulatory Visit: Payer: Self-pay

## 2015-11-09 DIAGNOSIS — Z1231 Encounter for screening mammogram for malignant neoplasm of breast: Secondary | ICD-10-CM

## 2015-11-10 ENCOUNTER — Ambulatory Visit: Payer: Commercial Managed Care - HMO | Admitting: Physical Therapy

## 2015-11-10 ENCOUNTER — Encounter: Payer: Self-pay | Admitting: Physical Therapy

## 2015-11-10 DIAGNOSIS — R269 Unspecified abnormalities of gait and mobility: Secondary | ICD-10-CM

## 2015-11-10 DIAGNOSIS — M25651 Stiffness of right hip, not elsewhere classified: Secondary | ICD-10-CM | POA: Diagnosis not present

## 2015-11-10 DIAGNOSIS — M25561 Pain in right knee: Secondary | ICD-10-CM | POA: Diagnosis not present

## 2015-11-10 DIAGNOSIS — G8929 Other chronic pain: Secondary | ICD-10-CM

## 2015-11-10 NOTE — Therapy (Addendum)
[facility] [address] [place], [place], [address] Phone: [phone]   Fax:  [phone]  Physical Therapy Treatment  Patient Details  Name: Destiny Robbins MRN: [id number] Date of Birth: [birth year] Referring Provider: Colin Benton, MD  Encounter Date: 11/10/2015      PT End of Session - 11/10/15 1028    Visit Number 3   Number of Visits 10   Date for PT Re-Evaluation 12/27/15   PT Start Time 0175   PT Stop Time 1100   PT Time Calculation (min) 45 min   Activity Tolerance Patient tolerated treatment well   Behavior During Therapy [facility] for tasks assessed/performed      Past Medical History  Diagnosis Date  . Chicken pox   . Colon polyp   . UTI (urinary tract infection)   . Anxiety     Past Surgical History  Procedure Laterality Date  . Total abdominal hysterectomy  2001  . Excision morton's neuroma    . Breast enhancement surgery    . Breast implant removal      There were no vitals filed for this visit.  Visit Diagnosis:  Knee pain, chronic, right  Hip stiffness, right  Abnormality of gait      Subjective Assessment - 11/10/15 1024    Subjective Pt has no more fear with her right leg, and is very pleased with her progression and wishes to be D/C     Pertinent History Morton's neuroma on Rt foot,    Limitations Walking;Standing   How long can you stand comfortably? unlimited   How long can you walk comfortably? unlimited   Diagnostic tests x-ray: OA in the Rt knee   Patient Stated Goals Pt is planning to return next year, due to x-mas activities   Currently in Pain? No/denies            [facility] PT Assessment - 11/10/15 0001    Assessment   Medical Diagnosis Rt knee OA (M17.9)   Referring Provider Colin Benton, MD   Onset Date/Surgical Date 08/02/15   Next MD Visit 11/25/15   Precautions   Precautions None   Restrictions   Weight Bearing Restrictions No   Balance Screen   Has the  patient fallen in the past 6 months No   Has the patient had a decrease in activity level because of a fear of falling?  No   Is the patient reluctant to leave their home because of a fear of falling?  No   Home Ecologist residence   [place] to enter   Entrance Stairs-Number of Steps [place] One level   Prior Function   Level of [place] Retired   Leisure exercise ([place])   Cognition   Overall Cognitive Status Within Functional Limits for tasks assessed   Observation/Other Assessments   Focus on Therapeutic Outcomes (FOTO)  21%   Posture/Postural Control   Posture/Postural Control No significant limitations   ROM / Strength   AROM / PROM / Strength AROM   AROM   Overall AROM  Within functional limits for tasks performed   Overall AROM Comments full knee AROM of Rt knee with posterior knee pain at end range AROM. Hamstring flexibility limited by 25% bilaterally. Hip ER limited by 25% on the Rt vs the Lt   Strength   Overall Strength Within functional limits  for tasks performed   Overall Strength Comments 4+/5 to 5/5 bilateral LE strength   Palpation   Patella mobility normal with crepitus with patellar mobs in all directions.  Pain with superior glide of patella.     Palpation comment Tenderness over distal 1/2 of hamstring on the Rt and proximal gastroc.     Transfers   Transfers Sit to Stand;Stand to Sit   Sit to Stand 7: Independent   Ambulation/Gait   Ambulation/Gait Yes   Ambulation/Gait Assistance 7: Independent   Ambulation Distance (Feet) 100 Feet   Gait Pattern Step-through pattern;Decreased step length - right;Decreased stance time - right;Decreased dorsiflexion - right                     [facility] Adult PT Treatment/Exercise - 11/10/15 0001    Exercises   Exercises Knee/Hip   Knee/Hip Exercises: Stretches   Active Hamstring Stretch  Both;3 reps;20 seconds   Hip Flexor Stretch Right;20 seconds   Piriformis Stretch Both;3 reps;20 seconds  in supine and sitting   Knee/Hip Exercises: Aerobic   Stationary Bike L 1 x 47mn                PT Education - 11/10/15 1048    Education provided Yes   Education Details figure 4 in supine   Person(s) Educated Patient   Methods Explanation;Demonstration;Handout   Comprehension Verbalized understanding;Returned demonstration          PT Short Term Goals - 11/10/15 1034    PT SHORT TERM GOAL #1   Title be independent in initial HEP   Time 4   Period Weeks   Status Achieved   PT SHORT TERM GOAL #2   Title demonstrate symmetry with gait on level surface   Time 4   Period Weeks   Status Achieved   PT SHORT TERM GOAL #3   Title report a 50% reduction in Rt leg pain with standing and walking in the community   Time 4   Period Weeks   Status Achieved           PT Long Term Goals - 11/10/15 1040    PT LONG TERM GOAL #1   Title be independent in advanced HEP   Time 8   Period Weeks   Status Achieved   PT LONG TERM GOAL #2   Title reduce FOTO to < or = to 33% limitaiton   Time 8   Period Weeks   Status Achieved   PT LONG TERM GOAL #3   Title return to regular exercise without limitation or increased Rt LE pain   Time 8   Period Weeks   Status Achieved   PT LONG TERM GOAL #5   Title report a 60% reduction in Rt LE pain with standing and walking in the community   Time 8   Period Weeks   Status Achieved               Plan - 11/10/15 1030    Clinical Impression Statement Pt reports back to normal, AROM is WFL in Rt hip and knee and patient with good performance of stretching exercises   Pt will benefit from skilled therapeutic intervention in order to improve on the following deficits Abnormal gait;Decreased range of motion;Difficulty walking;Pain;Impaired flexibility;Decreased activity tolerance   Rehab Potential Good   PT Frequency 2x /  week   PT Duration 8 weeks   PT Treatment/Interventions ADLs/Self Care Home Management;Cryotherapy;Electrical Stimulation;Moist Heat;Therapeutic exercise;Therapeutic activities;Stair  training;Gait training;Ultrasound;Neuromuscular re-education;Patient/family education;Manual techniques;Passive range of motion   PT Next Visit Plan D/C    PT Home Exercise Plan current HEP   Consulted and Agree with Plan of Care Patient          G-Codes - 2015-12-10 1134    Functional Assessment Tool Used FOTO: 21% limitation   Functional Limitation Mobility: Walking and moving around   Mobility: Walking and Moving Around Goal Status [phone]) At least 20 percent but less than 40 percent impaired, limited or restricted   Mobility: Walking and Moving Around Discharge Status [phone]) At least 20 percent but less than 40 percent impaired, limited or restricted      Problem List Patient Active Problem List   Diagnosis Date Noted  . Chronic daily headache 10/28/2014  . Depression 10/28/2014    Elke Naumann-Houegnifio, PTA Dec 10, 2015 11:38 AM PHYSICAL THERAPY DISCHARGE SUMMARY  Visits from Start of Care: 3 Current functional level related to goals / functional outcomes: See above.  Pt attended 3 PT sessions and will be discharged to HEP as she is feeling well and will do exercises after discharge.     Remaining deficits: Pt denies any deficits at this time.  All goals met.     Education / Equipment: HEP Plan: Patient agrees to discharge.  Patient goals were met. Patient is being discharged due to meeting the stated rehab goals.  ?????    Sigurd Sos, PT 12-10-15 11:38 AM [facility] [address] [place], [place], [address] Phone: [phone]   Fax:  [phone]  Name: Destiny Robbins MRN: [id number] Date of Birth: 1946-08-20

## 2015-11-10 NOTE — Patient Instructions (Signed)
Piriformis (Supine)    Cross legs, right on top. Gently pull other knee toward chest until stretch is felt in buttock/hip of top leg. Hold __20__ seconds. Repeat _3___ times per set. Do _1___ sets per session. Do __3__ sessions per day.  http://orth.exer.us/677   Copyright  VHI. All rights reserved.  [place] [address], [place], [place] [address] Phone # [phone] Fax [phone]

## 2015-11-17 ENCOUNTER — Ambulatory Visit: Payer: Commercial Managed Care - HMO

## 2015-11-25 ENCOUNTER — Ambulatory Visit: Payer: Self-pay | Admitting: Family Medicine

## 2015-12-06 ENCOUNTER — Ambulatory Visit: Payer: Self-pay

## 2015-12-13 ENCOUNTER — Encounter: Payer: Self-pay | Admitting: Family Medicine

## 2015-12-13 ENCOUNTER — Ambulatory Visit (INDEPENDENT_AMBULATORY_CARE_PROVIDER_SITE_OTHER): Payer: Commercial Managed Care - HMO | Admitting: Family Medicine

## 2015-12-13 ENCOUNTER — Ambulatory Visit
Admission: RE | Admit: 2015-12-13 | Discharge: 2015-12-13 | Disposition: A | Discharge status: Home / Self Care | Payer: Commercial Managed Care - HMO | Source: Ambulatory Visit

## 2015-12-13 ENCOUNTER — Ambulatory Visit: Payer: Self-pay | Admitting: Family Medicine

## 2015-12-13 VITALS — BP 110/62 | HR 91 | Temp 98.1°F | Ht 65.5 in | Wt 140.6 lb

## 2015-12-13 DIAGNOSIS — Z0000 Encounter for general adult medical examination without abnormal findings: Secondary | ICD-10-CM

## 2015-12-13 DIAGNOSIS — Z1231 Encounter for screening mammogram for malignant neoplasm of breast: Secondary | ICD-10-CM | POA: Diagnosis not present

## 2015-12-13 NOTE — Patient Instructions (Signed)
BEFORE YOU LEAVE: -cologuard information - please let us know if you wish to do this -follow up yearly and as needed  Please complete and return the stool cards  We recommend the following healthy lifestyle measures: - eat a healthy whole foods diet consisting of regular small meals composed of vegetables, fruits, beans, nuts, seeds, healthy meats such as white chicken and fish and whole grains.  - avoid sweets, white starchy foods, fried foods, fast food, processed foods, sodas, red meet and other fattening foods.  - get a least 150-300 minutes of aerobic exercise per week.

## 2015-12-13 NOTE — Progress Notes (Signed)
Pre visit review using our clinic review tool, if applicable. No additional management support is needed unless otherwise documented below in the visit note. 

## 2015-12-13 NOTE — Progress Notes (Signed)
Medicare Annual Preventive Care Visit  (initial annual wellness or annual wellness exam)  Destiny Robbins is a pleasant 70 yo here for her wellness visit. She has a history of complete abdominal hysterectomy and breast augmentation and removal. She declines any lab work, dexa or pelvic or breast exam. She reports she has had two colonoscopies and refuses any more. Agrees to consider cologuard. She reports: denies:  ROS: negative for report of fevers, unintentional weight loss, vision changes, vision loss, hearing loss or change, chest pain, sob, hemoptysis, melena, hematochezia, hematuria, genital discharge or lesions, falls, bleeding or bruising, loc, thoughts of suicide or self harm, memory loss  1.) Patient-completed health risk assessment  - completed and reviewed, see scanned documentation  2.) Review of Medical History: -PMH, PSH, Family History and current specialty and care providers reviewed and updated and listed below  - see scanned in document in chart and below  Past Medical History  Diagnosis Date  . Chicken pox   . Colon polyp   . UTI (urinary tract infection)   . Anxiety     Past Surgical History  Procedure Laterality Date  . Total abdominal hysterectomy  2001  . Excision morton's neuroma    . Breast enhancement surgery    . Breast implant removal      Social History   Social History  . Marital Status: Unknown    Spouse Name: N/A  . Number of Children: N/A  . Years of Education: N/A   Occupational History  . Not on file.   Social History Main Topics  . Smoking status: Never Smoker   . Smokeless tobacco: Not on file  . Alcohol Use: 0.0 oz/week    0 Standard drinks or equivalent per week     Comment: occ; about 4 times per year   . Drug Use: No  . Sexual Activity: Not on file   Other Topics Concern  . 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  No current outpatient prescriptions on file prior to visit.   No current facility-administered medications on file prior to visit.     3.) Review of functional ability and level of safety:  Any difficulty hearing?  NO  History of falling?  NO  Any trouble with IADLs - using a phone, using transportation, grocery shopping, preparing meals, doing housework, doing laundry, taking medications and managing money? NO  Advance Directives? NO  See summary of recommendations in Patient Instructions below.  4.) Physical Exam Filed Vitals:   12/13/15 1500  BP: 110/62  Pulse: 91  Temp: 98.1 F (36.7 C)   Estimated body mass index is 23.03 kg/(m^2) as calculated from the following:   Height as of this encounter: 5' 5.5" (1.664 m).   Weight as of this encounter: 140 lb 9.6 oz (63.776 kg).  EKG (optional): deferred  General: alert, appear well hydrated and in no acute distress  HEENT: visual acuity grossly intact  CV: HRRR  Lungs: CTA bilaterally  Psych: pleasant and cooperative, no obvious depression or anxiety  Cognitive function grossly intact  See patient instructions for recommendations.  Education and counseling regarding the above review of health provided with a plan for the following: -see scanned patient completed form for further details -fall prevention strategies discussed  -healthy lifestyle discussed -importance and resources for completing advanced directives discussed -see patient instructions below for any other recommendations provided  4)The following written screening schedule of preventive measures were reviewed with assessment and plan made per below, orders and patient instructions:      AAA screening done if applicable     Alcohol screening done     Obesity Screening and  counseling done     STI screening (Hep C if born [birth year]-65) offered and per pt wishes     Tobacco Screening done done       Pneumococcal (PPSV23 -one dose after 64, one before if risk factors), influenza yearly and hepatitis B vaccines (if high risk - end stage renal disease, IV drugs, homosexual men, live in home for mentally retarded, hemophilia receiving factors) ASSESSMENT/PLAN: done if applicable      Screening mammograph (yearly if >40) ASSESSMENT/PLAN: utd or ordered - reports she did this today      Screening Pap smear/pelvic exam (q2 years) ASSESSMENT/PLAN: n/a, declined pelvic      Prostate cancer screening ASSESSMENT/PLAN: n/a, declined      Colorectal cancer screening (FOBT yearly or flex sig q4y or colonoscopy q10y or barium enema q4y) ASSESSMENT/PLAN: reports she had two colonoscopies in the past in [place] - she does not know the name of the clinic or doctor and reports she will not ever have another colonoscopy and refuses. She agrees to check on cost of cologuard and get if covered. She will consider yearly stool cards but refuses other options and understands risks.      Diabetes outpatient self-management training services ASSESSMENT/PLAN: utd or done      Bone mass measurements(covered q2y if indicated - estrogen def, osteoporosis, hyperparathyroid, vertebral abnormalities, osteoporosis or steroids) ASSESSMENT/PLAN: utd or discussed and ordered per pt wishes - patient refused - reprots she would never take treatment. Advise weight bearing exercise and vit D3 800-100IU with adequate calcium.      Screening for glaucoma(q1y if high risk - diabetes, FH, AA and > 50 or hispanic and > 65) ASSESSMENT/PLAN: utd or advised; reports she sees Dr. [place]      Medical nutritional therapy for individuals with diabetes or renal disease ASSESSMENT/PLAN: refused      Cardiovascular screening blood tests (lipids q5y) ASSESSMENT/PLAN: refused      Diabetes screening  tests ASSESSMENT/PLAN: refused   7.) Summary: -risk factors and conditions per above assessment were discussed and treatment, recommendations and referrals were offered per documentation above and orders and patient instructions.  Visit for preventive health examination  Patient Instructions  BEFORE YOU LEAVE: -cologuard information - please let us know if you wish to do this -follow up yearly and as needed  Please complete and return the stool cards  We recommend the following healthy lifestyle measures: - eat a healthy whole foods diet consisting of regular small meals composed of vegetables, fruits, beans, nuts, seeds, healthy meats such as white chicken and fish and whole grains.  - avoid sweets, white starchy foods, fried foods, fast food, processed foods, sodas, red meet and other fattening foods.  - get a least 150-300 minutes of aerobic exercise per week.

## 2015-12-22 ENCOUNTER — Ambulatory Visit: Payer: Self-pay

## 2016-03-28 DIAGNOSIS — F331 Major depressive disorder, recurrent, moderate: Secondary | ICD-10-CM | POA: Diagnosis not present

## 2016-04-04 DIAGNOSIS — F331 Major depressive disorder, recurrent, moderate: Secondary | ICD-10-CM | POA: Diagnosis not present

## 2016-04-10 ENCOUNTER — Telehealth: Payer: Self-pay | Admitting: Family Medicine

## 2016-04-10 NOTE — Telephone Encounter (Signed)
I called the pt and informed her of the message below.  Appt was scheduled for tomorrow at 10:15am.

## 2016-04-10 NOTE — Telephone Encounter (Signed)
Recommend appointment, so that we can help her. Okay to work in. I am not sure that bioidentical hormones will be the best treatment for this.

## 2016-04-10 NOTE — Telephone Encounter (Signed)
Patient needs a referral to:  Glendale Chard, MD [phone] Ext [place]

## 2016-04-10 NOTE — Telephone Encounter (Signed)
Accidentally closed

## 2016-04-10 NOTE — Telephone Encounter (Signed)
I called the pt and informed her of the message below and she stated she wanted to see this doctor and talk about bio-identical hormones for depression.

## 2016-04-10 NOTE — Telephone Encounter (Addendum)
Maybe find out what she plans to see Dr. Tye Savoy for? We don't do referral for this.

## 2016-04-11 ENCOUNTER — Ambulatory Visit (INDEPENDENT_AMBULATORY_CARE_PROVIDER_SITE_OTHER): Payer: Commercial Managed Care - HMO | Admitting: Family Medicine

## 2016-04-11 ENCOUNTER — Encounter: Payer: Self-pay | Admitting: Family Medicine

## 2016-04-11 VITALS — BP 112/80 | HR 88 | Temp 97.9°F | Ht 65.5 in | Wt 132.4 lb

## 2016-04-11 DIAGNOSIS — N951 Menopausal and female climacteric states: Secondary | ICD-10-CM

## 2016-04-11 DIAGNOSIS — F411 Generalized anxiety disorder: Secondary | ICD-10-CM | POA: Diagnosis not present

## 2016-04-11 DIAGNOSIS — R232 Flushing: Secondary | ICD-10-CM

## 2016-04-11 DIAGNOSIS — F339 Major depressive disorder, recurrent, unspecified: Secondary | ICD-10-CM

## 2016-04-11 MED ORDER — ESCITALOPRAM OXALATE 5 MG PO TABS: 5.0000 mg | ORAL_TABLET | Freq: Every day | ORAL | Status: DC

## 2016-04-11 NOTE — Progress Notes (Signed)
HPI:  Depression/GAD: - today admits this has been ongoing since she was 69 years old - intermittent mild to severe depression, anhedonia, poor sleep, generalized worry about everything, irritability, decreased energy, occasional thoughts of worthlessness and hopelessness, occasional thoughts of suicide but denies ever attempting suicide or a plan - had spell a few weeks ago when she did not get out of bed for 3 days, then took 10 Xanax is that she had at home to "feel numb,  Get a release," she denies attempting to harm herself or commit suicide at the time,  Reports that she did not feel that this could cause her any serious harm - reports she has been on many many medications in the past but would only take for a few days and did not like the side effects -  Side effects she can remember include headache, upset stomach or feeling different - family history of depression in mother, sister and brother -  Reports she thinks her brother had bipolar disorder, reports sister took Lexapro which helped her - reports her mood is interfering with her relationships with her son and her family -hx anxiety was treated with benzos and effexor in the past,  But she does not like side effects to either these - she does admit to feeling bored and thinks that she needs to get a job and is considering, but again she has a hard time sticking to anything - reports she saw a Social worker, Doroteo Bradford that her sister advised for 2 visits and she needs a retro-referral for her insurance for this,  She was not happy with this counselor and will not be returning -she wishes to consider bio-identical hormones for this - denies current thoughts of self harm, suicidal ideation, manic symptoms, panic attacks, or hallucinations, or thoughts of harming others - strong faith, also feels she has good support and lots of friends and family to talk to that are supportive - she had a hysterectomy for heavy bleeding at age 56,  suffered from hot flashes and was on hormone replacement therapy for 15 years, this was stopped at age 38 -  Feels her hot flashes are actually improving, though still does get some hot flashes which are tolerable  FH: cancer? Total Cholesterol  ROS: See pertinent positives and negatives per HPI.  Past Medical History  Diagnosis Date  . Chicken pox   . Colon polyp   . UTI (urinary tract infection)   . Anxiety     Past Surgical History  Procedure Laterality Date  . Total abdominal hysterectomy  2001  . Excision morton's neuroma    . Breast enhancement surgery    . Breast implant removal      No family history on file.  Social History   Social History  . Marital Status: Unknown    Spouse Name: N/A  . Number of Children: N/A  . Years of Education: N/A   Social History Main Topics  . Smoking status: Never Smoker   . Smokeless tobacco: None  . Alcohol Use: 0.0 oz/week    0 Standard drinks or equivalent per week     Comment: occ; about 4 times per year   . Drug Use: No  . Sexual Activity: Not Asked   Other Topics Concern  . 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  escitalopram (LEXAPRO) 5 MG tablet, Take 1 tablet (5 mg total) by mouth daily., Disp: 30 tablet, Rfl: 1  EXAM:  Filed Vitals:   04/11/16 1011  BP: 112/80  Pulse: 88  Temp: 97.9 F (36.6 C)    Body mass index is 21.69 kg/(m^2).  GENERAL: vitals reviewed and listed above, alert, oriented, appears well hydrated and in no acute distress  HEENT: atraumatic, conjunttiva clear, no obvious abnormalities on inspection of external nose and ears  NECK: no obvious masses on inspection  LUNGS: clear to auscultation  bilaterally, no wheezes, rales or rhonchi, good air movement  CV: HRRR, no peripheral edema  MS: moves all extremities without noticeable abnormality  PSYCH: pleasant and cooperative, no obvious depression or anxiety  ASSESSMENT AND PLAN:  > 40 minutes spent with this patient with > 50% spent face to face in counseling. Discussed the following assessment and plan:  Episode of recurrent major depressive disorder, unspecified depression episode severity ([place])  GAD (generalized anxiety disorder)  Hot flashes  - I am so glad that she came in today and I am hopeful that we can help her as she has been sick for a long time and is suffering. -discussed recommended and approved treatment options for depression at length;  Given her recent episode of taking multiple Xanax is, advised that she be under the care of psychiatrist -  She repetitively repeats that she was not attempting suicide and did not know that this could harm her and feels safe.  We did sign a contract for safety and this was scanned in and provided for the patient to take as well with numbers to call if she is ever having thoughts of doing this again or any thoughts of self-harm or suicide. - She opted to try  Lexapro, and started a very low dose given her concerns for side effects. This may also help her mild hot flashes. - she agreed to try cognitive behavioral therapy with Dr. Glennon Hamilton, and in the antrum discussed several exercises she could try when she is feeling overwhelmed or anxious or depressed -advised against BIH given quality, safety and dosing concerns and discussed risks and lack of current scientific data to support -advised of reasons to treat with approved HRT and risks and advised basic labs with lipids to asses CV risks if she wishes to consider -Patient advised to return or notify a doctor immediately if symptoms worsen or persist or new concerns arise.  Patient Instructions  BEFORE YOU LEAVE: -schedule follow up  in 4 weeks  Start the lexapro and take once daily.  I expect that the maximum benefit from this and adjustment we will be at about 4-6 weeks. This is a very low dose  Below the normal dosing level and it is unlikely that you will have any serious side effects. We will plan to increase slowly given your history of intolerance to medications in the past.  Call today to schedule an appointment with Dr. Glennon Hamilton for cognitive behavioral therapy.  Call to set up appointment with psychiatrist.  In the interim, please Consider getting involved in more activities as you suggested , and try activities to calm the brain when you are feeling overwhelmed or stressed. One such activity is repeating a calming or relaxing  uplifting phrase.  Seek care immediately if you're having thoughts of hurting herself, you are feeling out of control, you have worsening depression or you have thoughts of taking multiple pills.     Colin Benton R.

## 2016-04-11 NOTE — Patient Instructions (Addendum)
BEFORE YOU LEAVE: -schedule follow up in 4 weeks  Start the lexapro and take once daily.  I expect that the maximum benefit from this and adjustment we will be at about 4-6 weeks. This is a very low dose  Below the normal dosing level and it is unlikely that you will have any serious side effects. We will plan to increase slowly given your history of intolerance to medications in the past.  Call today to schedule an appointment with Dr. Glennon Hamilton for cognitive behavioral therapy.  Call to set up appointment with psychiatrist.  In the interim, please Consider getting involved in more activities as you suggested , and try activities to calm the brain when you are feeling overwhelmed or stressed. One such activity is repeating a calming or relaxing uplifting phrase.  Seek care immediately if you're having thoughts of hurting herself, you are feeling out of control, you have worsening depression or you have thoughts of taking multiple pills.

## 2016-04-11 NOTE — Progress Notes (Signed)
Pre visit review using our clinic review tool, if applicable. No additional management support is needed unless otherwise documented below in the visit note. 

## 2016-05-09 ENCOUNTER — Encounter: Payer: Self-pay | Admitting: Family Medicine

## 2016-05-09 ENCOUNTER — Ambulatory Visit (INDEPENDENT_AMBULATORY_CARE_PROVIDER_SITE_OTHER): Payer: Commercial Managed Care - HMO | Admitting: Family Medicine

## 2016-05-09 VITALS — BP 100/62 | HR 72 | Temp 98.0°F | Ht 65.5 in | Wt 133.5 lb

## 2016-05-09 DIAGNOSIS — F411 Generalized anxiety disorder: Secondary | ICD-10-CM | POA: Diagnosis not present

## 2016-05-09 DIAGNOSIS — F3342 Major depressive disorder, recurrent, in full remission: Secondary | ICD-10-CM | POA: Diagnosis not present

## 2016-05-09 MED ORDER — ESCITALOPRAM OXALATE 5 MG PO TABS: 5.0000 mg | ORAL_TABLET | Freq: Every day | ORAL | Status: DC

## 2016-05-09 NOTE — Patient Instructions (Signed)
Before you leave:  schedule a follow-up appointment in 3 months    continue the Lexapro    seek care immediately if you're having worsening or new symptoms or any recurring symptoms of depression, anxiety or thoughts of harming herself or taking extra medications.  We recommend the following healthy lifestyle measures: - eat a healthy whole foods diet consisting of regular small meals composed of vegetables, fruits, beans, nuts, seeds, healthy meats such as white chicken and fish and whole grains.  - avoid sweets, white starchy foods, fried foods, fast food, processed foods, sodas, red meet and other fattening foods.  - get a least 150-300 minutes of aerobic exercise per week.

## 2016-05-09 NOTE — Progress Notes (Signed)
Pre visit review using our clinic review tool, if applicable. No additional management support is needed unless otherwise documented below in the visit note. 

## 2016-05-09 NOTE — Progress Notes (Signed)
HPI:   Follow up anxiety and depression: -see OV notes 03/2016 for details of history -started at age 70 -symptoms of depression and GAD -denies hx SI, but had event in 03/2016 when took 10 of a friend's xanax to "feel numb";  Adamantly denied that this was an attempt to harm herself -did not like benzos or effexor in the past -FH mental health conditions in mother, sister and brother -counselor: Pharmacologist, but she did not like her -strong christian faith -on 03/2016 we started lexapro low dose due to her concerns for side effect and advise cbt and psychiatry help - provided numbers to contact and emergency options - she reports that she has been taking the 5 mg of Lexapro and has actively sought out a job and activities to keep herself busy. She reports she is doing  " great"  without any symptoms of depression, anxiety, psychosis, or thoughts of self-harm. - she is helping friends with construction problem tract and is really enjoying this and she has an application in for a job at [employer] - She did not see psychiatry or a Social worker, as she feels like she is doing well and does not need this - she wishes to continue the Lexapro and follow up with me - She agrees to seek psychiatric care immediately if worsening symptoms or if she ever feels like taking extra medications or harming himself   ROS: See pertinent positives and negatives per HPI.  Past Medical History  Diagnosis Date  . Chicken pox   . Colon polyp   . UTI (urinary tract infection)   . Anxiety     Past Surgical History  Procedure Laterality Date  . Total abdominal hysterectomy  2001  . Excision morton's neuroma    . Breast enhancement surgery    . Breast implant removal      No family history on file.  Social History   Social History  . Marital Status: Unknown    Spouse Name: N/A  . Number of Children: N/A  . Years of Education: N/A   Social History Main Topics  . Smoking status: Never Smoker   .  Smokeless tobacco: None  . Alcohol Use: 0.0 oz/week    0 Standard drinks or equivalent per week     Comment: occ; about 4 times per year   . Drug Use: No  . Sexual Activity: Not Asked   Other Topics Concern  . 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  escitalopram (LEXAPRO) 5 MG tablet, Take 1 tablet (5 mg total) by mouth daily., Disp: 30 tablet, Rfl: 1  EXAM:  Filed Vitals:   05/09/16 1010  BP: 100/62  Pulse: 72  Temp: 98 F (36.7 C)    Body mass index is 21.87 kg/(m^2).  GENERAL: vitals reviewed and listed above, alert, oriented, appears well hydrated and in no acute distress  HEENT: atraumatic, conjunttiva clear, no obvious abnormalities on inspection of external nose and ears  NECK: no obvious masses on inspection  LUNGS: clear to auscultation bilaterally, no wheezes, rales or rhonchi, good air movement  CV: HRRR, no  peripheral edema  MS: moves all extremities without noticeable abnormality  PSYCH: pleasant and cooperative, no obvious depression or anxiety  ASSESSMENT AND PLAN:  Discussed the following assessment and plan:  Recurrent major depressive disorder, in full remission ([place])  GAD (generalized anxiety disorder)  -agreed to continue care here with low dose lexapro given she agrees to seek care Immediately if she ever has worsening or severe symptoms - advised CBT, she reports she will consider, however she feels she is doing great right now - follow-up in 3 months or sooner as needed -Patient advised to return or notify a doctor immediately if symptoms worsen or persist or new concerns arise.  Patient Instructions   Before you leave:  schedule a follow-up  appointment in 3 months    continue the Lexapro    seek care immediately if you're having worsening or new symptoms or any recurring symptoms of depression, anxiety or thoughts of harming herself or taking extra medications.  We recommend the following healthy lifestyle measures: - eat a healthy whole foods diet consisting of regular small meals composed of vegetables, fruits, beans, nuts, seeds, healthy meats such as white chicken and fish and whole grains.  - avoid sweets, white starchy foods, fried foods, fast food, processed foods, sodas, red meet and other fattening foods.  - get a least 150-300 minutes of aerobic exercise per week.       Colin Benton R.

## 2016-07-04 ENCOUNTER — Telehealth: Payer: Self-pay | Admitting: Family Medicine

## 2016-07-04 NOTE — Telephone Encounter (Signed)
Patient states that she wants to increase her mgs on Lexapro.  She stated at her last visit she was told she could increase it but wanted to stay at the dosage she was at, however she feels like she is having trouble "getting going" and needs the little extra.

## 2016-07-04 NOTE — Telephone Encounter (Signed)
I left a message for the pt to return my call. 

## 2016-07-04 NOTE — Telephone Encounter (Signed)
I would like to see her given her history. Can she come on Thursday? Increasing the dose is ok - but I want to see her to determine if that is all that is needed or if other treatment is needed. Ok to increase to 10mg  until seen, but needs appt in next 1 week.Thanks.

## 2016-07-06 ENCOUNTER — Ambulatory Visit (INDEPENDENT_AMBULATORY_CARE_PROVIDER_SITE_OTHER): Payer: Commercial Managed Care - HMO | Admitting: Family Medicine

## 2016-07-06 ENCOUNTER — Encounter: Payer: Self-pay | Admitting: Family Medicine

## 2016-07-06 VITALS — BP 120/70 | HR 78 | Temp 98.0°F | Ht 65.5 in | Wt 133.2 lb

## 2016-07-06 DIAGNOSIS — F324 Major depressive disorder, single episode, in partial remission: Secondary | ICD-10-CM

## 2016-07-06 DIAGNOSIS — R5383 Other fatigue: Secondary | ICD-10-CM | POA: Diagnosis not present

## 2016-07-06 LAB — VITAMIN B12: Vitamin B-12: 384 pg/mL (ref 211–911)

## 2016-07-06 LAB — BASIC METABOLIC PANEL
BUN: 17 mg/dL (ref 6–23)
CALCIUM: 10 mg/dL (ref 8.4–10.5)
CO2: 30 meq/L (ref 19–32)
Chloride: 100 mEq/L (ref 96–112)
Creatinine, Ser: 0.94 mg/dL (ref 0.40–1.20)
GFR: 62.98 mL/min (ref >60.00)
Glucose, Bld: 93 mg/dL (ref 70–99)
POTASSIUM: 4.7 meq/L (ref 3.5–5.1)
SODIUM: 136 meq/L (ref 135–145)

## 2016-07-06 LAB — TSH: TSH: 2.09 u[IU]/mL (ref 0.35–4.50)

## 2016-07-06 LAB — CBC
HCT: 40.4 % (ref 36.0–46.0)
Hemoglobin: 13.6 g/dL (ref 12.0–15.0)
MCHC: 33.7 g/dL (ref 30.0–36.0)
MCV: 90.5 fl (ref 78.0–100.0)
PLATELETS: 299 10*3/uL (ref 150.0–400.0)
RBC: 4.46 Mil/uL (ref 3.87–5.11)
RDW: 13.7 % (ref 11.5–15.5)
WBC: 10.4 10*3/uL (ref 4.0–10.5)

## 2016-07-06 LAB — VITAMIN D 25 HYDROXY (VIT D DEFICIENCY, FRACTURES): VITD: 25.26 ng/mL — AB (ref 30.00–100.00)

## 2016-07-06 LAB — HEMOGLOBIN A1C: Hgb A1c MFr Bld: 6 % (ref 4.6–6.5)

## 2016-07-06 MED ORDER — ESCITALOPRAM OXALATE 10 MG PO TABS: 10.0000 mg | ORAL_TABLET | Freq: Every day | ORAL | 2 refills | Status: DC

## 2016-07-06 NOTE — Patient Instructions (Signed)
BEFORE YOU LEAVE: -follow up: keep appointment that is scheduled in September - this should be enough time to let the new dose start working -labs  Increase the lexapro to 10 mg daily. Follow up sooner if any worsening or new concerns.  We have ordered labs or studies at this visit. It can take up to 1-2 weeks for results and processing. IF results require follow up or explanation, we will call you with instructions. Clinically stable results will be released to your [facility]. If you have not heard from [place] or cannot find your results in [facility] in 2 weeks please contact our office at [phone].  If you are not yet signed up for [facility], please consider signing up.

## 2016-07-06 NOTE — Progress Notes (Signed)
Pre visit review using our clinic review tool, if applicable. No additional management support is needed unless otherwise documented below in the visit note. 

## 2016-07-06 NOTE — Progress Notes (Addendum)
HPI:  Follow up:  Depression/anxiety: -hx taking friends pills to feel numb -meds: lexapro - very low dose in the past per her wishes, then requested increased to regular dose recently -reports: while has felt significantly better on the low dose lexapro has persistent anhedonia and some fatigue in the morning -denies: depressed or irritable mood, thoughts of self harm, fevers, malaise, cough, bowel changes, panic -[id number]  ROS: See pertinent positives and negatives per HPI.  Past Medical History:  Diagnosis Date  . Anxiety   . Chicken pox   . Colon polyp   . UTI (urinary tract infection)     Past Surgical History:  Procedure Laterality Date  . BREAST ENHANCEMENT SURGERY    . BREAST IMPLANT REMOVAL    . EXCISION MORTON'S NEUROMA    . TOTAL ABDOMINAL HYSTERECTOMY  2001    No family history on file.  Social History   Social History  . Marital status: Unknown    Spouse name: N/A  . Number of children: N/A  . Years of education: N/A   Social History Main Topics  . Smoking status: Never Smoker  . Smokeless tobacco: None  . Alcohol use 0.0 oz/week     Comment: occ; about 4 times per year   . Drug use: No  . Sexual activity: Not Asked   Other Topics Concern  . 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  escitalopram (LEXAPRO) 10 MG tablet, Take 1 tablet (10 mg total) by mouth daily., Disp: 30 tablet, Rfl: 2  EXAM:  Vitals:   07/06/16 1446  BP: 120/70  Pulse: 78  Temp: 98 F (36.7 C)    Body mass index is 21.83 kg/m.  GENERAL: vitals reviewed and listed above, alert, oriented, appears well hydrated and in no acute distress  HEENT: atraumatic, conjunttiva clear, no  obvious abnormalities on inspection of external nose and ears  NECK: no obvious masses on inspection  LUNGS: clear to auscultation bilaterally, no wheezes, rales or rhonchi, good air movement  CV: HRRR, no peripheral edema  MS: moves all extremities without noticeable abnormality  PSYCH: pleasant and cooperative, no obvious depression or anxiety  ASSESSMENT AND PLAN:  Discussed the following assessment and plan:  Other fatigue - Plan: CBC (no diff), Hemoglobin A1c, TSH, Basic metabolic panel, Vitamin 123456, Vitamin D, 25-hydroxy  Major depressive disorder with single episode, in partial remission (HCC)  -increase lexapro to 10mg  -labs to r/o other causes fatigue -return and emergency precuations -consider CBT -follow up 1 month as scheduled -Patient advised to return or notify a doctor immediately if symptoms worsen or persist or new concerns arise.  Patient Instructions  BEFORE YOU LEAVE: -follow up: keep appointment that is scheduled in September - this should be enough time to let the new dose start working -labs  Increase the lexapro to 10 mg daily. Follow up sooner if any worsening or new concerns.  We have ordered labs or studies at this visit. It can take up to 1-2 weeks for results and processing. IF results require follow up or explanation, we will call you with instructions. Clinically stable  results will be released to your [facility]. If you have not heard from [place] or cannot find your results in [facility] in 2 weeks please contact our office at [phone].  If you are not yet signed up for [facility], please consider signing up.           Colin Benton R., DO

## 2016-07-06 NOTE — Telephone Encounter (Signed)
I called the pt and informed her of the message below.  Appt scheduled for today at 2:45pm.

## 2016-08-07 NOTE — Progress Notes (Signed)
HPI:  Follow up:  Depression/anxiety: -hx taking friend's pills to feel numb -meds: lexapro - very low dose in the past per her wishes, then requested increased to regular dose recently -reports:  Think medication has not helped and wants to wean off - main symptoms is anhedonia -she has plan to do the following instead of medication: got a dog, daughter whom she loves is moving in with her, has lots of supportive friends and family to alk to, joined two [place] clubs and is excited about this, going to start back to swimming -denies: thoughts of self harm, fevers, malaise, cough, bowel changes, panic -VEH:MCNO  ROS: See pertinent positives and negatives per HPI.  Past Medical History:  Diagnosis Date  . Anxiety   . Chicken pox   . Colon polyp   . UTI (urinary tract infection)     Past Surgical History:  Procedure Laterality Date  . BREAST ENHANCEMENT SURGERY    . BREAST IMPLANT REMOVAL    . EXCISION MORTON'S NEUROMA    . TOTAL ABDOMINAL HYSTERECTOMY  2001    No family history on file.  Social History   Social History  . Marital status: Unknown    Spouse name: N/A  . Number of children: N/A  . Years of education: N/A   Social History Main Topics  . Smoking status: Never Smoker  . Smokeless tobacco: None  . Alcohol use 0.0 oz/week     Comment: occ; about 4 times per year   . Drug use: No  . Sexual activity: Not Asked   Other Topics Concern  . 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  Cholecalciferol (VITAMIN D3 PO), Take 2,000 Units by mouth daily., Disp: , Rfl:  .  escitalopram (LEXAPRO) 5 MG tablet, Take 1 tablet (5 mg total) by mouth daily., Disp: 30 tablet, Rfl:  0  EXAM:  Vitals:   08/08/16 0939  BP: 100/70  Pulse: 78  Temp: 97.8 F (36.6 C)    Body mass index is 21.57 kg/m.  GENERAL: vitals reviewed and listed above, alert, oriented, appears well hydrated and in no acute distress  HEENT: atraumatic, conjunttiva clear, no obvious abnormalities on inspection of external nose and ears  NECK: no obvious masses on inspection  MS: moves all extremities without noticeable abnormality  PSYCH: pleasant and cooperative, no obvious depression or anxiety  ASSESSMENT AND PLAN:  Discussed the following assessment and plan:  Recurrent major depressive disorder, in partial remission ([place])  -I like her plan to add balance to life - did advise adding CBT as well and resources provided -taper off lexapro per her wishes, discussed how to taper, she currently declines trying any other meds -she feels safe and agrees to seek medical care/emergency care if needed -follow up in 2 months -flu shot today -Patient advised to return or notify a doctor immediately if symptoms worsen or persist or new concerns arise.  Patient Instructions  BEFORE YOU LEAVE: -flu shot -follow up: 2 months  Wean off of the lexapro slowly as we discussed. I sent the lower dose to your pharmacy.  Please consider  starting Cognitive Behavioral Therapy. Please call to schedule.  Please seek care immediately if worsening depression and seek emergency care as we discussed if severe depression or thoughts of self harm.     Colin Benton R., DO

## 2016-08-08 ENCOUNTER — Encounter: Payer: Self-pay | Admitting: Family Medicine

## 2016-08-08 ENCOUNTER — Ambulatory Visit (INDEPENDENT_AMBULATORY_CARE_PROVIDER_SITE_OTHER): Payer: Commercial Managed Care - HMO | Admitting: Family Medicine

## 2016-08-08 VITALS — BP 100/70 | HR 78 | Temp 97.8°F | Ht 65.5 in | Wt 131.6 lb

## 2016-08-08 DIAGNOSIS — Z23 Encounter for immunization: Secondary | ICD-10-CM

## 2016-08-08 DIAGNOSIS — F3341 Major depressive disorder, recurrent, in partial remission: Secondary | ICD-10-CM

## 2016-08-08 MED ORDER — ESCITALOPRAM OXALATE 5 MG PO TABS: 5.0000 mg | ORAL_TABLET | Freq: Every day | ORAL | 0 refills | Status: DC

## 2016-08-08 NOTE — Patient Instructions (Signed)
BEFORE YOU LEAVE: -flu shot -follow up: 2 months  Wean off of the lexapro slowly as we discussed. I sent the lower dose to your pharmacy.  Please consider starting Cognitive Behavioral Therapy. Please call to schedule.  Please seek care immediately if worsening depression and seek emergency care as we discussed if severe depression or thoughts of self harm.

## 2016-08-08 NOTE — Progress Notes (Signed)
Pre visit review using our clinic review tool, if applicable. No additional management support is needed unless otherwise documented below in the visit note. 

## 2016-10-08 NOTE — Progress Notes (Signed)
  HPI:  Depression and anxiety: -hx taking a friend's pills to "feel numb" -on lexapro briefly in 2017 then wanted to wean off -at last visit plans for: dog, daughter moving in, lots of friends and family supportive, [place], swimming, exercise -reports: doing great, no complaints, wishes to continue off medication -denies:depression, anxiety  ROS: See pertinent positives and negatives per HPI.  Past Medical History:  Diagnosis Date  . Anxiety   . Chicken pox   . Colon polyp   . UTI (urinary tract infection)     Past Surgical History:  Procedure Laterality Date  . BREAST ENHANCEMENT SURGERY    . BREAST IMPLANT REMOVAL    . EXCISION MORTON'S NEUROMA    . TOTAL ABDOMINAL HYSTERECTOMY  2001    No family history on file.  Social History   Social History  . Marital status: Unknown    Spouse name: N/A  . Number of children: N/A  . Years of education: N/A   Social History Main Topics  . Smoking status: Never Smoker  . Smokeless tobacco: None  . Alcohol use 0.0 oz/week     Comment: occ; about 4 times per year   . Drug use: No  . Sexual activity: Not Asked   Other Topics Concern  . 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  Cholecalciferol (VITAMIN D3 PO), Take 2,000 Units by mouth daily., Disp: , Rfl:   EXAM:  Vitals:   10/09/16 0932  BP: (!) 92/54  Pulse: (!) 102  Temp: 98.3 F (36.8 C)    Body mass index is 22.09 kg/m.  GENERAL: vitals reviewed and listed above, alert, oriented, appears well hydrated and in no acute distress  HEENT: atraumatic, conjunttiva clear, no obvious abnormalities on inspection of external nose and ears  NECK: no obvious masses on  inspection  LUNGS: clear to auscultation bilaterally, no wheezes, rales or rhonchi, good air movement  CV: HRRR, no peripheral edema  MS: moves all extremities without noticeable abnormality  PSYCH: pleasant and cooperative, no obvious depression or anxiety  ASSESSMENT AND PLAN:  Discussed the following assessment and plan:  Anxiety  Recurrent major depressive disorder, in full remission ([place])  -Wellness visit in 3 months -Patient advised to return or notify a doctor immediately if symptoms worsen or persist or new concerns arise.  Patient Instructions  BEFORE YOU LEAVE: -follow up: Medicare wellness visit in 3-4 months  We advise colon cancer screening. Please let us know when you are ready to do this.     Colin Benton R., DO

## 2016-10-09 ENCOUNTER — Encounter: Payer: Self-pay | Admitting: Family Medicine

## 2016-10-09 ENCOUNTER — Ambulatory Visit (INDEPENDENT_AMBULATORY_CARE_PROVIDER_SITE_OTHER): Payer: Commercial Managed Care - HMO | Admitting: Family Medicine

## 2016-10-09 VITALS — BP 92/54 | HR 102 | Temp 98.3°F | Ht 65.5 in | Wt 134.8 lb

## 2016-10-09 DIAGNOSIS — F3342 Major depressive disorder, recurrent, in full remission: Secondary | ICD-10-CM

## 2016-10-09 DIAGNOSIS — F419 Anxiety disorder, unspecified: Secondary | ICD-10-CM

## 2016-10-09 NOTE — Patient Instructions (Signed)
BEFORE YOU LEAVE: -follow up: Medicare wellness visit in 3-4 months  We advise colon cancer screening. Please let us know when you are ready to do this.

## 2016-10-09 NOTE — Progress Notes (Signed)
Pre visit review using our clinic review tool, if applicable. No additional management support is needed unless otherwise documented below in the visit note. 

## 2016-12-14 ENCOUNTER — Ambulatory Visit: Payer: Self-pay | Admitting: Family Medicine

## 2017-02-23 DIAGNOSIS — M545 Low back pain: Secondary | ICD-10-CM | POA: Diagnosis not present

## 2017-02-23 DIAGNOSIS — M546 Pain in thoracic spine: Secondary | ICD-10-CM | POA: Diagnosis not present

## 2017-02-23 DIAGNOSIS — M542 Cervicalgia: Secondary | ICD-10-CM | POA: Diagnosis not present

## 2017-02-23 DIAGNOSIS — M5137 Other intervertebral disc degeneration, lumbosacral region: Secondary | ICD-10-CM | POA: Diagnosis not present

## 2017-02-23 DIAGNOSIS — M791 Myalgia: Secondary | ICD-10-CM | POA: Diagnosis not present

## 2017-02-23 DIAGNOSIS — M47816 Spondylosis without myelopathy or radiculopathy, lumbar region: Secondary | ICD-10-CM | POA: Diagnosis not present

## 2017-02-23 DIAGNOSIS — M5126 Other intervertebral disc displacement, lumbar region: Secondary | ICD-10-CM | POA: Diagnosis not present

## 2017-02-23 DIAGNOSIS — M5127 Other intervertebral disc displacement, lumbosacral region: Secondary | ICD-10-CM | POA: Diagnosis not present

## 2017-03-01 DIAGNOSIS — M5137 Other intervertebral disc degeneration, lumbosacral region: Secondary | ICD-10-CM | POA: Diagnosis not present

## 2017-03-01 DIAGNOSIS — M47816 Spondylosis without myelopathy or radiculopathy, lumbar region: Secondary | ICD-10-CM | POA: Diagnosis not present

## 2017-03-01 DIAGNOSIS — H5213 Myopia, bilateral: Secondary | ICD-10-CM | POA: Diagnosis not present

## 2017-03-01 DIAGNOSIS — M546 Pain in thoracic spine: Secondary | ICD-10-CM | POA: Diagnosis not present

## 2017-03-01 DIAGNOSIS — M5126 Other intervertebral disc displacement, lumbar region: Secondary | ICD-10-CM | POA: Diagnosis not present

## 2017-03-01 DIAGNOSIS — M542 Cervicalgia: Secondary | ICD-10-CM | POA: Diagnosis not present

## 2017-03-01 DIAGNOSIS — M5127 Other intervertebral disc displacement, lumbosacral region: Secondary | ICD-10-CM | POA: Diagnosis not present

## 2017-03-01 DIAGNOSIS — M545 Low back pain: Secondary | ICD-10-CM | POA: Diagnosis not present

## 2017-03-01 DIAGNOSIS — M791 Myalgia: Secondary | ICD-10-CM | POA: Diagnosis not present

## 2017-03-05 DIAGNOSIS — M47816 Spondylosis without myelopathy or radiculopathy, lumbar region: Secondary | ICD-10-CM | POA: Diagnosis not present

## 2017-03-05 DIAGNOSIS — M546 Pain in thoracic spine: Secondary | ICD-10-CM | POA: Diagnosis not present

## 2017-03-05 DIAGNOSIS — M5127 Other intervertebral disc displacement, lumbosacral region: Secondary | ICD-10-CM | POA: Diagnosis not present

## 2017-03-05 DIAGNOSIS — M5126 Other intervertebral disc displacement, lumbar region: Secondary | ICD-10-CM | POA: Diagnosis not present

## 2017-03-05 DIAGNOSIS — M5137 Other intervertebral disc degeneration, lumbosacral region: Secondary | ICD-10-CM | POA: Diagnosis not present

## 2017-03-05 DIAGNOSIS — M542 Cervicalgia: Secondary | ICD-10-CM | POA: Diagnosis not present

## 2017-03-05 DIAGNOSIS — M791 Myalgia: Secondary | ICD-10-CM | POA: Diagnosis not present

## 2017-03-05 DIAGNOSIS — M545 Low back pain: Secondary | ICD-10-CM | POA: Diagnosis not present

## 2017-03-05 NOTE — Progress Notes (Signed)
Medicare Annual Preventive Care Visit  (initial annual wellness or annual wellness exam)  Concerns and/or follow up today:  PMH Depression, Anxiety, headaches, complete hysterectomy, breast augmentation and then removal. She has declined physical exam, colonoscopies and some other preventive measures in the past. She reports her mood has been fine and has no complaints. Due for and advised colon ca screening, lipid check, bone density, hep c screening, mammogram.  Depression screen neg. She refuses yearly mammogram, bone density and hepatitis c screening today. Agrees to cologard.  Advanced directives are done and she does not wish to change. See scanned documentation for further details of HPI. ROS: negative for report of fevers, unintentional weight loss, vision changes, vision loss, hearing loss or change, chest pain, sob, hemoptysis, melena, hematochezia, hematuria, genital discharge or lesions, falls, bleeding or bruising, loc, thoughts of suicide or self harm, memory loss  1.) Patient-completed health risk assessment  - completed and reviewed, see scanned documentation  2.) Review of Medical History: -PMH, PSH, Family History and current specialty and care providers reviewed and updated and listed below  - see scanned in document in chart and below  Past Medical History:  Diagnosis Date  . Anxiety   . Chicken pox   . Colon polyp   . UTI (urinary tract infection)     Past Surgical History:  Procedure Laterality Date  . BREAST ENHANCEMENT SURGERY    . BREAST IMPLANT REMOVAL    . EXCISION MORTON'S NEUROMA    . TOTAL ABDOMINAL HYSTERECTOMY  2001    Social History   Social History  . Marital status: Unknown    Spouse name: N/A  . Number of children: N/A  . Years of education: N/A   Occupational History  . Not on file.   Social History Main Topics  . Smoking status: Never Smoker  . Smokeless tobacco: Never Used  . Alcohol use 0.0 oz/week     Comment: occ; about 4  times per year   . Drug use: No  . Sexual activity: Not on file   Other Topics Concern  . 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  Current Outpatient Prescriptions on File Prior to Visit  Medication Sig Dispense Refill  . Cholecalciferol (VITAMIN D3 PO) Take 2,000 Units by mouth daily.     No current facility-administered medications on file prior to visit.      3.) Review of functional ability and level of safety:  Any difficulty hearing?  See scanned documentation  History of falling?  See scanned documentation  Any trouble with IADLs - using a phone, using transportation, grocery shopping, preparing meals, doing housework, doing laundry, taking medications and managing money?  See scanned documentation  Advance Directives?  Discussed briefly and offered more resources and detailed discussion with our trained staff. Done and refuses further education.   See summary of recommendations in Patient Instructions below.  4.) Physical Exam Vitals:   03/06/17 1128  BP: 118/78  Pulse: 92  Temp: 98 F (36.7 C)   Estimated body mass index is 22.5 kg/m as calculated from the following:  Height as of this encounter: 5' 5.5" (1.664 m).   Weight as of this encounter: 137 lb 4.8 oz (62.3 kg).  EKG (optional): deferred  General: alert, appear well hydrated and in no acute distress  HEENT: visual acuity grossly intact  CV: HRRR  Lungs: CTA bilaterally  Psych: pleasant and cooperative, no obvious depression or anxiety  Cognitive function grossly intact  See patient instructions for recommendations.  Education and counseling regarding the above review of health provided with a plan for  the following: -see scanned patient completed form for further details -fall prevention strategies discussed  -healthy lifestyle discussed -importance and resources for completing advanced directives discussed -see patient instructions below for any other recommendations provided  4)The following written screening schedule of preventive measures were reviewed with assessment and plan made per below, orders and patient instructions:      AAA screening done if applicable     Alcohol screening done     Obesity Screening and counseling done     STI screening (Hep C if born [birth year]) offered and per pt wishes     Tobacco Screening done        Pneumococcal (PPSV23 -one dose after 64, one before if risk factors), influenza yearly and hepatitis B vaccines (if high risk - end stage renal disease, IV drugs, homosexual men, live in home for mentally retarded, hemophilia receiving factors) ASSESSMENT/PLAN: done if applicable      Screening mammograph (yearly if >40) ASSESSMENT/PLAN: she has opted to do q 2 years, declined CBE, agrees to self breast exams      Screening Pap smear/pelvic exam (q2 years) ASSESSMENT/PLAN: n/a, declined      Colorectal cancer screening (FOBT yearly or flex sig q4y or colonoscopy q10y or barium enema q4y) ASSESSMENT/PLAN: has had in the past, due, she agreed to cologard after lengthy discussion.      Diabetes outpatient self-management training services ASSESSMENT/PLAN: refuses labs      Bone mass measurements(covered q2y if indicated - estrogen def, osteoporosis, hyperparathyroid, vertebral abnormalities, osteoporosis or steroids) ASSESSMENT/PLAN: refuses testing, agrees to vit d and wt bearing exercise      Screening for glaucoma(q1y if high risk - diabetes, FH, AA and > 50 or hispanic and > 65) ASSESSMENT/PLAN: utd or advised      Medical nutritional therapy for individuals with diabetes or renal disease ASSESSMENT/PLAN: n/a      Cardiovascular screening  blood tests (lipids q5y) ASSESSMENT/PLAN: refuses labs      Diabetes screening tests ASSESSMENT/PLAN: refuses labs   7.) Summary:   Encounter for Medicare annual wellness exam -risk factors and conditions per above assessment were discussed and treatment, recommendations and referrals were offered per documentation above and orders and patient instructions. Due for and advised colon ca screening, lipid check, bone density, hep c screening, mammogram. She refuses yearly mammogram, bone density and hepatitis c screening today. Agrees to cologard.  Advanced directives are done and she does not wish to change. Patient Instructions   BEFORE YOU LEAVE: -please order cologard -follow up: Wellness visit in 1 year  Please do you self breast exams.  We ordered the Cologuard test for colon cancer screening. Please complete this test promptly once the kit arrives. Please contact us if you have not received your kit in the next few weeks.  We recommend the following healthy lifestyle for LIFE: 1) Small portions.   Tip: eat off of a salad plate instead of a dinner plate.  Tip: It is ok to feel  hungry after a meal - that likely means you ate an appropriate portion.  Tip: if you need more or a snack choose fruits, veggies and/or a handful of nuts or seeds.  2) Eat a healthy clean diet.  * Tip: Avoid (less then 1 serving per week): processed foods, sweets, sweetened drinks, white starches (rice, flour, bread, potatoes, pasta, etc), red meat, fast foods, butter  *Tip: CHOOSE instead   * 5-9 servings per day of fresh or frozen fruits and vegetables (but not corn, potatoes, bananas, canned or dried fruit)   *nuts and seeds, beans   *olives and olive oil   *small portions of lean meats such as fish and white chicken    *small portions of whole grains  3)Get at least 150 minutes of sweaty aerobic exercise per week.  4)Reduce stress - consider counseling, meditation and relaxation to balance  other aspects of your life.    Ms. Daffern , Thank you for taking time to come for your Medicare Wellness Visit. I appreciate your ongoing commitment to your health goals. Please review the following plan we discussed and let me know if I can assist you in the future.   These are the goals we discussed: Goals    See above      This is a list of the screening recommended for you and due dates:  Health Maintenance  Topic Date Due  . Cologuard (Stool DNA test)  09/16/1998  . DEXA scan (bone density measurement)  03/06/2017*  .  Hepatitis C: One time screening is recommended by Center for Disease Control  (CDC) for  adults born from [birth year] through 1963.   03/06/2017*  . Flu Shot  06/27/2017  . Mammogram  12/12/2017  . Tetanus Vaccine  05/27/2022  . Pneumonia vaccines  Completed  *Topic was postponed. The date shown is not the original due date.      Colin Benton R., DO

## 2017-03-06 ENCOUNTER — Encounter: Payer: Self-pay | Admitting: Family Medicine

## 2017-03-06 ENCOUNTER — Ambulatory Visit (INDEPENDENT_AMBULATORY_CARE_PROVIDER_SITE_OTHER): Payer: Medicare HMO | Admitting: Family Medicine

## 2017-03-06 VITALS — BP 118/78 | HR 92 | Temp 98.0°F | Ht 65.5 in | Wt 137.3 lb

## 2017-03-06 DIAGNOSIS — Z0000 Encounter for general adult medical examination without abnormal findings: Secondary | ICD-10-CM

## 2017-03-06 NOTE — Progress Notes (Signed)
Pre visit review using our clinic review tool, if applicable. No additional management support is needed unless otherwise documented below in the visit note. 

## 2017-03-06 NOTE — Patient Instructions (Addendum)
BEFORE YOU LEAVE: -please order cologard -follow up: Wellness visit in 1 year  Please do you self breast exams.  We ordered the Cologuard test for colon cancer screening. Please complete this test promptly once the kit arrives. Please contact us if you have not received your kit in the next few weeks.  We recommend the following healthy lifestyle for LIFE: 1) Small portions.   Tip: eat off of a salad plate instead of a dinner plate.  Tip: It is ok to feel hungry after a meal - that likely means you ate an appropriate portion.  Tip: if you need more or a snack choose fruits, veggies and/or a handful of nuts or seeds.  2) Eat a healthy clean diet.  * Tip: Avoid (less then 1 serving per week): processed foods, sweets, sweetened drinks, white starches (rice, flour, bread, potatoes, pasta, etc), red meat, fast foods, butter  *Tip: CHOOSE instead   * 5-9 servings per day of fresh or frozen fruits and vegetables (but not corn, potatoes, bananas, canned or dried fruit)   *nuts and seeds, beans   *olives and olive oil   *small portions of lean meats such as fish and white chicken    *small portions of whole grains  3)Get at least 150 minutes of sweaty aerobic exercise per week.  4)Reduce stress - consider counseling, meditation and relaxation to balance other aspects of your life.    Destiny Robbins , Thank you for taking time to come for your Medicare Wellness Visit. I appreciate your ongoing commitment to your health goals. Please review the following plan we discussed and let me know if I can assist you in the future.   These are the goals we discussed: Goals    See above      This is a list of the screening recommended for you and due dates:  Health Maintenance  Topic Date Due  . Cologuard (Stool DNA test)  09/16/1998  . DEXA scan (bone density measurement)  03/06/2017*  .  Hepatitis C: One time screening is recommended by Center for Disease Control  (CDC) for  adults born from 1943  through 1965.   03/06/2017*  . Flu Shot  06/27/2017  . Mammogram  12/12/2017  . Tetanus Vaccine  05/27/2022  . Pneumonia vaccines  Completed  *Topic was postponed. The date shown is not the original due date.    

## 2017-03-07 DIAGNOSIS — M5126 Other intervertebral disc displacement, lumbar region: Secondary | ICD-10-CM | POA: Diagnosis not present

## 2017-03-07 DIAGNOSIS — M5127 Other intervertebral disc displacement, lumbosacral region: Secondary | ICD-10-CM | POA: Diagnosis not present

## 2017-03-07 DIAGNOSIS — M546 Pain in thoracic spine: Secondary | ICD-10-CM | POA: Diagnosis not present

## 2017-03-07 DIAGNOSIS — M5137 Other intervertebral disc degeneration, lumbosacral region: Secondary | ICD-10-CM | POA: Diagnosis not present

## 2017-03-07 DIAGNOSIS — M791 Myalgia: Secondary | ICD-10-CM | POA: Diagnosis not present

## 2017-03-07 DIAGNOSIS — M47816 Spondylosis without myelopathy or radiculopathy, lumbar region: Secondary | ICD-10-CM | POA: Diagnosis not present

## 2017-03-07 DIAGNOSIS — M545 Low back pain: Secondary | ICD-10-CM | POA: Diagnosis not present

## 2017-03-07 DIAGNOSIS — M542 Cervicalgia: Secondary | ICD-10-CM | POA: Diagnosis not present

## 2017-03-11 DIAGNOSIS — Z1211 Encounter for screening for malignant neoplasm of colon: Secondary | ICD-10-CM | POA: Diagnosis not present

## 2017-03-11 DIAGNOSIS — Z1212 Encounter for screening for malignant neoplasm of rectum: Secondary | ICD-10-CM | POA: Diagnosis not present

## 2017-03-12 LAB — COLOGUARD: Cologuard: NEGATIVE

## 2017-03-16 ENCOUNTER — Encounter: Payer: Self-pay | Admitting: Family Medicine

## 2018-01-29 DIAGNOSIS — R69 Illness, unspecified: Secondary | ICD-10-CM | POA: Diagnosis not present

## 2018-02-03 ENCOUNTER — Ambulatory Visit (HOSPITAL_COMMUNITY)
Admission: EM | Admit: 2018-02-03 | Discharge: 2018-02-03 | Disposition: A | Discharge status: Home / Self Care | Payer: Medicare HMO | Attending: Internal Medicine | Admitting: Internal Medicine

## 2018-02-03 ENCOUNTER — Encounter (HOSPITAL_COMMUNITY): Payer: Self-pay | Admitting: Emergency Medicine

## 2018-02-03 DIAGNOSIS — T148XXA Other injury of unspecified body region, initial encounter: Secondary | ICD-10-CM | POA: Diagnosis not present

## 2018-02-03 MED ORDER — MELOXICAM 7.5 MG PO TABS: 7.5000 mg | ORAL_TABLET | Freq: Every day | ORAL | 0 refills | Status: DC

## 2018-02-03 MED ORDER — CYCLOBENZAPRINE HCL 10 MG PO TABS: 5.0000 mg | ORAL_TABLET | Freq: Every evening | ORAL | 0 refills | Status: DC | PRN

## 2018-02-03 NOTE — Discharge Instructions (Signed)
Your exam was most consistent with muscle strain. Start Mobic as directed. Flexeril as needed at night. Flexeril can make you drowsy, so do not take if you are going to drive, operate heavy machinery, or make important decisions. Ice/heat compresses as needed. This can take up to 3-4 weeks to completely resolve, but you should be feeling better each week. Follow up here or with PCP if symptoms worsen, changes for reevaluation. If experience numbness/tingling of the inner thighs, loss of bladder or bowel control, go to the emergency department for evaluation.

## 2018-02-03 NOTE — ED Provider Notes (Signed)
[facility]    CSN: [id number] Arrival date & time: 02/03/18  1200     History   Chief Complaint Chief Complaint  Patient presents with  . Back Pain    HPI Destiny Robbins is a 72 y.o. female.   72 year old healthy female comes in for one-week history of low back pain.  Patient states she has had similar symptoms in the past from "overworking".  States she was cutting her own would a week ago, and was carrying the wood on her own as well, worked a little longer than she should have.  States she had right lumbar pain that is intermittent and clenching in sensation.  States certain movement causes the pain to be worse.  She has been using icy hot, Advil, heating pad with some relief.  She states usually symptoms will resolve on own with those treatment, but had worsening pain last night and now pain is constant.  She took family members muscle relaxant with some relief.  She denies numbness, tingling, saddle anesthesia, loss of bladder or bowel control.  Denies radiation of pain.  Denies urinary symptoms such as frequency, dysuria, hematuria.      Past Medical History:  Diagnosis Date  . Anxiety   . Chicken pox   . Colon polyp   . UTI (urinary tract infection)     Patient Active Problem List   Diagnosis Date Noted  . Chronic daily headache 10/28/2014    Past Surgical History:  Procedure Laterality Date  . BREAST ENHANCEMENT SURGERY    . BREAST IMPLANT REMOVAL    . EXCISION MORTON'S NEUROMA    . TOTAL ABDOMINAL HYSTERECTOMY  2001    OB History    No data available       Home Medications    Prior to Admission medications   Medication Sig Start Date End Date Taking? Authorizing Provider  Cholecalciferol (VITAMIN D3 PO) Take 2,000 Units by mouth daily.    [provider]  cyclobenzaprine (FLEXERIL) 10 MG tablet Take 0.5-1 tablets (5-10 mg total) by mouth at bedtime as needed for muscle spasms. 02/03/18   Tasia Catchings, Sawsan Riggio V, PA-C  meloxicam (MOBIC) 7.5 MG  tablet Take 1 tablet (7.5 mg total) by mouth daily. 02/03/18   Ok Edwards, PA-C    Family History History reviewed. No pertinent family history.  Social History Social History   Tobacco Use  . Smoking status: Never Smoker  . Smokeless tobacco: Never Used  Substance Use Topics  . Alcohol use: Yes    Alcohol/week: 0.0 oz    Comment: occ; about 4 times per year   . Drug use: No     Allergies   Vicodin [hydrocodone-acetaminophen]   Review of Systems Review of Systems  Reason unable to perform ROS: See HPI as above.     Physical Exam Triage Vital Signs ED Triage Vitals  Enc Vitals Group     BP 02/03/18 1219 (!) 144/75     Pulse Rate 02/03/18 1219 81     Resp 02/03/18 1219 18     Temp 02/03/18 1219 98.4 F (36.9 C)     Temp Source 02/03/18 1219 Oral     SpO2 02/03/18 1219 100 %     Weight --      Height --      Head Circumference --      Peak Flow --      Pain Score 02/03/18 1220 8     Pain Loc --  Pain Edu? --      Excl. in [place]? --    No data found.  Updated Vital Signs BP (!) 144/75 (BP Location: Right Arm)   Pulse 81   Temp 98.4 F (36.9 C) (Oral)   Resp 18   SpO2 100%   Physical Exam  Constitutional: She is oriented to person, place, and time. She appears well-developed and well-nourished. No distress.  HENT:  Head: Normocephalic and atraumatic.  Eyes: Conjunctivae are normal. Pupils are equal, round, and reactive to light.  Cardiovascular: Normal rate, regular rhythm and normal heart sounds. Exam reveals no gallop and no friction rub.  No murmur heard. Pulmonary/Chest: Effort normal and breath sounds normal. She has no wheezes. She has no rales.  Musculoskeletal:  No tenderness on palpation of the spinous processes.  No obvious tenderness on palpation of bilateral lumbar region. Laying down for hip exam exacerbated the pain. Full range of motion of back and hip. Strength normal and equal bilaterally. Sensation intact and equal bilaterally. Negative  straight leg raise.   Neurological: She is alert and oriented to person, place, and time.  Skin: Skin is warm and dry.     UC Treatments / Results  Labs (all labs ordered are listed, but only abnormal results are displayed) Labs Reviewed - No data to display  EKG  EKG Interpretation None       Radiology No results found.  Procedures Procedures (including critical care time)  Medications Ordered in UC Medications - No data to display   Initial Impression / Assessment and Plan / UC Course  I have reviewed the triage vital signs and the nursing notes.  Pertinent labs & imaging results that were available during my care of the patient were reviewed by me and considered in my medical decision making (see chart for details).    Discussed with patient history and exam most consistent with muscle strain. Start NSAID as directed for pain and inflammation. Muscle relaxant as needed. Ice/heat compresses. Discussed with patient strain can take up to 3-4 weeks to resolve, but should be getting better each week. Return precautions given.  Patient expresses understanding and agrees to plan.  Final Clinical Impressions(s) / UC Diagnoses   Final diagnoses:  Muscle strain    ED Discharge Orders        Ordered    meloxicam (MOBIC) 7.5 MG tablet  Daily     02/03/18 1243    cyclobenzaprine (FLEXERIL) 10 MG tablet  At bedtime PRN     02/03/18 1243        Ok Edwards, PA-C 02/03/18 1249

## 2018-02-03 NOTE — ED Triage Notes (Signed)
Pt here for lower back pain with hx of same in past

## 2018-03-22 ENCOUNTER — Encounter: Payer: Self-pay | Admitting: Adult Health

## 2018-03-22 ENCOUNTER — Ambulatory Visit (INDEPENDENT_AMBULATORY_CARE_PROVIDER_SITE_OTHER): Payer: Medicare HMO | Admitting: Adult Health

## 2018-03-22 VITALS — BP 140/74 | HR 117 | Temp 100.9°F | Wt 136.0 lb

## 2018-03-22 DIAGNOSIS — J020 Streptococcal pharyngitis: Secondary | ICD-10-CM | POA: Diagnosis not present

## 2018-03-22 LAB — POC INFLUENZA A&B (BINAX/QUICKVUE)
Influenza A, POC: NEGATIVE
Influenza B, POC: NEGATIVE

## 2018-03-22 LAB — POCT RAPID STREP A (OFFICE): Rapid Strep A Screen: POSITIVE — AB

## 2018-03-22 MED ORDER — AMOXICILLIN 875 MG PO TABS: 875.0000 mg | ORAL_TABLET | Freq: Two times a day (BID) | ORAL | 0 refills | Status: DC

## 2018-03-22 MED ORDER — BENZONATATE 200 MG PO CAPS: 200.0000 mg | ORAL_CAPSULE | Freq: Two times a day (BID) | ORAL | 0 refills | Status: DC | PRN

## 2018-03-22 NOTE — Progress Notes (Signed)
Subjective:    Patient ID: Destiny Robbins, female    DOB: 30-Dec-1945, 72 y.o.   MRN: [id number]  URI   This is a new problem. The current episode started in the past 7 days. The maximum temperature recorded prior to her arrival was 100.4 - 100.9 F. Associated symptoms include congestion, coughing (semi productive ), headaches, rhinorrhea (yellow ), a sore throat and swollen glands. Pertinent negatives include no diarrhea, ear pain, nausea, plugged ear sensation, sinus pain, vomiting or wheezing. She has tried NSAIDs (Delsym, Cold and Flu ) for the symptoms. The treatment provided no relief.    Review of Systems  Constitutional: Positive for activity change, appetite change, diaphoresis, fatigue and fever.  HENT: Positive for congestion, rhinorrhea (yellow ), sinus pressure, sore throat and trouble swallowing. Negative for ear pain, sinus pain and voice change.   Respiratory: Positive for cough (semi productive ). Negative for chest tightness, shortness of breath, wheezing and stridor.   Cardiovascular: Negative.   Gastrointestinal: Negative for diarrhea, nausea and vomiting.  Neurological: Positive for headaches.     Past Medical History:  Diagnosis Date  . Anxiety   . Chicken pox   . Colon polyp   . UTI (urinary tract infection)     Social History   Socioeconomic History  . Marital status: Unknown    Spouse name: Not on file  . Number of children: Not on file  . Years of education: Not on file  . Highest education level: Not on file  Occupational History  . Not on file  Social Needs  . Financial resource strain: Not on file  . Food insecurity:    Worry: Not on file    Inability: Not on file  . Transportation needs:    Medical: Not on file    Non-medical: Not on file  Tobacco Use  . Smoking status: Never Smoker  . Smokeless tobacco: Never Used  Substance and Sexual Activity  . Alcohol use: Yes    Alcohol/week: 0.0 oz    Comment: occ; about 4 times per year   . Drug  use: No  . Sexual activity: Not on file  Lifestyle  . Physical activity:    Days per week: Not on file    Minutes per session: Not on file  . Stress: Not on file  Relationships  . Social connections:    Talks on phone: Not on file    Gets together: Not on file    Attends religious service: Not on file    Active member of club or organization: Not on file    Attends meetings of clubs or organizations: Not on file    Relationship status: Not on file  . Intimate partner violence:    Fear of current or ex partner: Not on file    Emotionally abused: Not on file    Physically abused: Not on file    Forced sexual activity: Not on file  Other Topics Concern  . 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 BREAST ENHANCEMENT SURGERY    . BREAST IMPLANT REMOVAL    .  EXCISION MORTON'S NEUROMA    . TOTAL ABDOMINAL HYSTERECTOMY  2001    History reviewed. No pertinent family history.  Allergies  Allergen Reactions  . Vicodin [Hydrocodone-Acetaminophen] Rash    No current outpatient medications on file prior to visit.   No current facility-administered medications on file prior to visit.     BP 140/74   Temp (!) 100.9 F (38.3 C)   Wt 136 lb (61.7 kg)   BMI 22.29 kg/m       Objective:   Physical Exam  Constitutional: She is oriented to person, place, and time. She appears well-developed and well-nourished. No distress.  HENT:  Head: Normocephalic and atraumatic.  Right Ear: Hearing, tympanic membrane, external ear and ear canal normal.  Left Ear: Hearing, tympanic membrane, external ear and ear canal normal.  Nose: Right sinus exhibits maxillary sinus tenderness and  frontal sinus tenderness. Left sinus exhibits maxillary sinus tenderness and frontal sinus tenderness.  Mouth/Throat: Uvula is midline and mucous membranes are normal. Posterior oropharyngeal edema and posterior oropharyngeal erythema present. No oropharyngeal exudate.  Eyes: Pupils are equal, round, and reactive to light. Conjunctivae and EOM are normal. Right eye exhibits no discharge. Left eye exhibits no discharge. No scleral icterus.  Cardiovascular: Normal rate, regular rhythm, normal heart sounds and intact distal pulses. Exam reveals no gallop and no friction rub.  No murmur heard. Pulmonary/Chest: Breath sounds normal. No respiratory distress. She has no wheezes. She has no rales. She exhibits no tenderness.  Neurological: She is alert and oriented to person, place, and time.  Skin: Skin is warm and dry. No rash noted. She is not diaphoretic. No erythema. No pallor.  Psychiatric: She has a normal mood and affect. Her behavior is normal. Judgment and thought content normal.  Nursing note and vitals reviewed.      Assessment & Plan:  1. Strep throat  - POC Influenza A&B(BINAX/QUICKVUE)- Negative - POC Rapid Strep A- Positive  - amoxicillin (AMOXIL) 875 MG tablet; Take 1 tablet (875 mg total) by mouth 2 (two) times daily.  Dispense: 20 tablet; Refill: 0 - benzonatate (TESSALON) 200 MG capsule; Take 1 capsule (200 mg total) by mouth 2 (two) times daily as needed for cough.  Dispense: 20 capsule; Refill: 0 - Warm drinks with honey/motrin/tylenol for symptom relief.  - Follow up in 2-3 days if no improvement   Destiny Peng, NP

## 2018-06-14 ENCOUNTER — Telehealth: Payer: Self-pay | Admitting: *Deleted

## 2018-06-14 NOTE — Telephone Encounter (Signed)
Copied from [place] [id number]. Topic: General - Other >> Jun 14, 2018 11:18 AM Yvette Rack wrote: Reason for CRM: physiatrist Wilber Bihari calling [phone] to see what depression medicine pt has used in the past her number is [phone] may leave a voice mail the provider  Wilber Bihari also want DR Maudie Mercury to know that she will be giving the pt Prozac 5mg 

## 2018-06-14 NOTE — Telephone Encounter (Signed)
Please see prior note in which I advised medical records release be sent to our office.

## 2018-06-14 NOTE — Telephone Encounter (Signed)
Angie called from the [facility] stating a psychiatrist by the name of Wilber Bihari is calling requesting to speak with someone now in regards to the medications the pt has been given.  I advised Angie to have Shirlean Mylar send a records release from the pt as I cannot give this info over the phone and she agreed.

## 2018-06-16 NOTE — Telephone Encounter (Signed)
We have not seen pt in over 1 year. If pt has signed release let her know she took lexapro breifly in the past. Please update medication list.

## 2018-06-18 ENCOUNTER — Ambulatory Visit (INDEPENDENT_AMBULATORY_CARE_PROVIDER_SITE_OTHER): Payer: Medicare HMO | Admitting: Family Medicine

## 2018-06-18 ENCOUNTER — Encounter: Payer: Self-pay | Admitting: Family Medicine

## 2018-06-18 VITALS — BP 124/72 | HR 69 | Temp 98.0°F | Ht 65.5 in | Wt 134.7 lb

## 2018-06-18 DIAGNOSIS — F411 Generalized anxiety disorder: Secondary | ICD-10-CM | POA: Diagnosis not present

## 2018-06-18 DIAGNOSIS — F331 Major depressive disorder, recurrent, moderate: Secondary | ICD-10-CM | POA: Diagnosis not present

## 2018-06-18 DIAGNOSIS — R69 Illness, unspecified: Secondary | ICD-10-CM | POA: Diagnosis not present

## 2018-06-18 MED ORDER — FLUOXETINE HCL 10 MG PO CAPS: 10.0000 mg | ORAL_CAPSULE | Freq: Every day | ORAL | 3 refills | Status: DC

## 2018-06-18 NOTE — Patient Instructions (Signed)
BEFORE YOU LEAVE: -follow up: AWV w/ Destiny Robbins AND follow up with Dr. Maudie Robbins on the same day in 4-6 weeks.  Start the prozac 10 mg and take once daily.  Continue regular counseling.  I hope you are feeling better soon! Seek care promptly if your symptoms worsen, new concerns arise or you are not improving with treatment.   Fluoxetine capsules or tablets (Depression/Mood Disorders) What is this medicine? FLUOXETINE (floo OX e teen) belongs to a class of drugs known as selective serotonin reuptake inhibitors (SSRIs). It helps to treat mood problems such as depression, obsessive compulsive disorder, and panic attacks. It can also treat certain eating disorders. This medicine may be used for other purposes; ask your health care provider or pharmacist if you have questions. COMMON BRAND NAME(S): Prozac What should I tell my health care provider before I take this medicine? They need to know if you have any of these conditions: -bipolar disorder or a family history of bipolar disorder -bleeding disorders -glaucoma -heart disease -liver disease -low levels of sodium in the blood -seizures -suicidal thoughts, plans, or attempt; a previous suicide attempt by you or a family member -take MAOIs like Carbex, Eldepryl, Marplan, Nardil, and Parnate -take medicines that treat or prevent blood clots -thyroid disease -an unusual or allergic reaction to fluoxetine, other medicines, foods, dyes, or preservatives -pregnant or trying to get pregnant -breast-feeding How should I use this medicine? Take this medicine by mouth with a glass of water. Follow the directions on the prescription label. You can take this medicine with or without food. Take your medicine at regular intervals. Do not take it more often than directed. Do not stop taking this medicine suddenly except upon the advice of your doctor. Stopping this medicine too quickly may cause serious side effects or your condition may worsen. A special  MedGuide will be given to you by the pharmacist with each prescription and refill. Be sure to read this information carefully each time. Talk to your pediatrician regarding the use of this medicine in children. While this drug may be prescribed for children as young as 7 years for selected conditions, precautions do apply. Overdosage: If you think you have taken too much of this medicine contact a poison control center or emergency room at once. NOTE: This medicine is only for you. Do not share this medicine with others. What if I miss a dose? If you miss a dose, skip the missed dose and go back to your regular dosing schedule. Do not take double or extra doses. What may interact with this medicine? Do not take this medicine with any of the following medications: -other medicines containing fluoxetine, like Sarafem or Symbyax -cisapride -linezolid -MAOIs like Carbex, Eldepryl, Marplan, Nardil, and Parnate -methylene blue (injected into a vein) -pimozide -thioridazine This medicine may also interact with the following medications: -alcohol -amphetamines -aspirin and aspirin-like medicines -carbamazepine -certain medicines for depression, anxiety, or psychotic disturbances -certain medicines for migraine headaches like almotriptan, eletriptan, frovatriptan, naratriptan, rizatriptan, sumatriptan, zolmitriptan -digoxin -diuretics -fentanyl -flecainide -furazolidone -isoniazid -lithium -medicines for sleep -medicines that treat or prevent blood clots like warfarin, enoxaparin, and dalteparin -NSAIDs, medicines for pain and inflammation, like ibuprofen or naproxen -phenytoin -procarbazine -propafenone -rasagiline -ritonavir -supplements like St. John's wort, kava kava, valerian -tramadol -tryptophan -vinblastine This list may not describe all possible interactions. Give your health care provider a list of all the medicines, herbs, non-prescription drugs, or dietary supplements you  use. Also tell them if you smoke, drink alcohol, or  use illegal drugs. Some items may interact with your medicine. What should I watch for while using this medicine? Tell your doctor if your symptoms do not get better or if they get worse. Visit your doctor or health care professional for regular checks on your progress. Because it may take several weeks to see the full effects of this medicine, it is important to continue your treatment as prescribed by your doctor. Patients and their families should watch out for new or worsening thoughts of suicide or depression. Also watch out for sudden changes in feelings such as feeling anxious, agitated, panicky, irritable, hostile, aggressive, impulsive, severely restless, overly excited and hyperactive, or not being able to sleep. If this happens, especially at the beginning of treatment or after a change in dose, call your health care professional. [name] may get drowsy or dizzy. Do not drive, use machinery, or do anything that needs mental alertness until you know how this medicine affects you. Do not stand or sit up quickly, especially if you are an older patient. This reduces the risk of dizzy or fainting spells. Alcohol may interfere with the effect of this medicine. Avoid alcoholic drinks. Your mouth may get dry. Chewing sugarless gum or sucking hard Destiny Robbins, and drinking plenty of water may help. Contact your doctor if the problem does not go away or is severe. This medicine may affect blood sugar levels. If you have diabetes, check with your doctor or health care professional before you change your diet or the dose of your diabetic medicine. What side effects may I notice from receiving this medicine? Side effects that you should report to your doctor or health care professional as soon as possible: -allergic reactions like skin rash, itching or hives, swelling of the face, lips, or tongue -anxious -black, tarry stools -breathing problems -changes in  vision -confusion -elevated mood, decreased need for sleep, racing thoughts, impulsive behavior -eye pain -fast, irregular heartbeat -feeling faint or lightheaded, falls -feeling agitated, angry, or irritable -hallucination, loss of contact with reality -loss of balance or coordination -loss of memory -painful or prolonged erections -restlessness, pacing, inability to keep still -seizures -stiff muscles -suicidal thoughts or other mood changes -trouble sleeping -unusual bleeding or bruising -unusually weak or tired -vomiting Side effects that usually do not require medical attention (report to your doctor or health care professional if they continue or are bothersome): -change in appetite or weight -change in sex drive or performance -diarrhea -dry mouth -headache -increased sweating -nausea -tremors This list may not describe all possible side effects. Call your doctor for medical advice about side effects. You may report side effects to FDA at 1-800-FDA-1088. Where should I keep my medicine? Keep out of the reach of children. Store at room temperature between 15 and 30 degrees C (59 and 86 degrees F). Throw away any unused medicine after the expiration date. NOTE: This sheet is a summary. It may not cover all possible information. If you have questions about this medicine, talk to your doctor, pharmacist, or health care provider.  2018 Elsevier/Gold Standard (2016-04-15 15:55:27)

## 2018-06-18 NOTE — Progress Notes (Signed)
HPI:  Using dictation device. Unfortunately this device frequently misinterprets words/phrases.  Acute visit for Anxiety and Depression: -recurrent -on several medications in the past effexor, xanax, lexapro (she reports caused diarrhea), paxil (which per notes it seems she reported may have caused diarrhea) and well butrin per her reported.  -per her report was treated by her gynecologist prior to seeing me -she is currently seeing a counselor whom recommended prozac and she wants to try this -current symptoms: chronic mild depressed mood, generalized anxiety -denies any thoughts of harm, SI, drug use, hallucinations or hx of manic symptoms or prior dx bipolar disorder -I recommended she see psychiatry in the past as she took several pills from a friend to "feel numb" in a remote incident -she does not want to see psychiatry and feels she has never had any similar or severe symptoms before or since that incident -she agrees to see psychiatry if issues with prozac, not improving or any worsening  ROS: See pertinent positives and negatives per HPI.  Past Medical History:  Diagnosis Date  . Anxiety   . Chicken pox   . Colon polyp   . UTI (urinary tract infection)     Past Surgical History:  Procedure Laterality Date  . BREAST ENHANCEMENT SURGERY    . BREAST IMPLANT REMOVAL    . EXCISION MORTON'S NEUROMA    . TOTAL ABDOMINAL HYSTERECTOMY  2001    History reviewed. No pertinent family history.  SOCIAL HX: see hpi   Current Outpatient Medications:  .  FLUoxetine (PROZAC) 10 MG capsule, Take 1 capsule (10 mg total) by mouth daily., Disp: 30 capsule, Rfl: 3  EXAM:  Vitals:   06/18/18 1005  BP: 124/72  Pulse: 69  Temp: 98 F (36.7 C)    Body mass index is 22.07 kg/m.  GENERAL: vitals reviewed and listed above, alert, oriented, appears well hydrated and in no acute distress  HEENT: atraumatic, conjunttiva clear, no obvious abnormalities on inspection of external nose  and ears  NECK: no obvious masses on inspection  LUNGS: clear to auscultation bilaterally, no wheezes, rales or rhonchi, good air movement  CV: HRRR, no peripheral edema  MS: moves all extremities without noticeable abnormality  PSYCH: pleasant and cooperative, no obvious depression or anxiety  ASSESSMENT AND PLAN:  Discussed the following assessment and plan:  Moderate episode of recurrent major depressive disorder (HCC)  GAD (generalized anxiety disorder)  -advised psychiatry care given hx, she feels is doing better now and no severe symptoms so wishes to do trial of prozac first and continue with her counselor -risks/benefit prozac discussed -follow up 1 month with AWV with [name] as well -Patient advised to return or notify a doctor immediately if symptoms worsen or persist or new concerns arise.  Patient Instructions  BEFORE YOU LEAVE: -follow up: [facility] w/ [name] AND follow up with Dr. Maudie Mercury on the same day in 4-6 weeks.  Start the prozac 10 mg and take once daily.  Continue regular counseling.  I hope you are feeling better soon! Seek care promptly if your symptoms worsen, new concerns arise or you are not improving with treatment.   Fluoxetine capsules or tablets (Depression/Mood Disorders) What is this medicine? FLUOXETINE (floo OX e teen) belongs to a class of drugs known as selective serotonin reuptake inhibitors (SSRIs). It helps to treat mood problems such as depression, obsessive compulsive disorder, and panic attacks. It can also treat certain eating disorders. This medicine may be used for other purposes; ask your health  care provider or pharmacist if you have questions. COMMON BRAND NAME(S): Prozac What should I tell my health care provider before I take this medicine? They need to know if you have any of these conditions: -bipolar disorder or a family history of bipolar disorder -bleeding disorders -glaucoma -heart disease -liver disease -low levels of  sodium in the blood -seizures -suicidal thoughts, plans, or attempt; a previous suicide attempt by you or a family member -take MAOIs like Carbex, Eldepryl, Marplan, Nardil, and Parnate -take medicines that treat or prevent blood clots -thyroid disease -an unusual or allergic reaction to fluoxetine, other medicines, foods, dyes, or preservatives -pregnant or trying to get pregnant -breast-feeding How should I use this medicine? Take this medicine by mouth with a glass of water. Follow the directions on the prescription label. You can take this medicine with or without food. Take your medicine at regular intervals. Do not take it more often than directed. Do not stop taking this medicine suddenly except upon the advice of your doctor. Stopping this medicine too quickly may cause serious side effects or your condition may worsen. A special MedGuide will be given to you by the pharmacist with each prescription and refill. Be sure to read this information carefully each time. Talk to your pediatrician regarding the use of this medicine in children. While this drug may be prescribed for children as young as 7 years for selected conditions, precautions do apply. Overdosage: If you think you have taken too much of this medicine contact a poison control center or emergency room at once. NOTE: This medicine is only for you. Do not share this medicine with others. What if I miss a dose? If you miss a dose, skip the missed dose and go back to your regular dosing schedule. Do not take double or extra doses. What may interact with this medicine? Do not take this medicine with any of the following medications: -other medicines containing fluoxetine, like Sarafem or Symbyax -cisapride -linezolid -MAOIs like Carbex, Eldepryl, Marplan, Nardil, and Parnate -methylene blue (injected into a vein) -pimozide -thioridazine This medicine may also interact with the following  medications: -alcohol -amphetamines -aspirin and aspirin-like medicines -carbamazepine -certain medicines for depression, anxiety, or psychotic disturbances -certain medicines for migraine headaches like almotriptan, eletriptan, frovatriptan, naratriptan, rizatriptan, sumatriptan, zolmitriptan -digoxin -diuretics -fentanyl -flecainide -furazolidone -isoniazid -lithium -medicines for sleep -medicines that treat or prevent blood clots like warfarin, enoxaparin, and dalteparin -NSAIDs, medicines for pain and inflammation, like ibuprofen or naproxen -phenytoin -procarbazine -propafenone -rasagiline -ritonavir -supplements like St. John's wort, kava kava, valerian -tramadol -tryptophan -vinblastine This list may not describe all possible interactions. Give your health care provider a list of all the medicines, herbs, non-prescription drugs, or dietary supplements you use. Also tell them if you smoke, drink alcohol, or use illegal drugs. Some items may interact with your medicine. What should I watch for while using this medicine? Tell your doctor if your symptoms do not get better or if they get worse. Visit your doctor or health care professional for regular checks on your progress. Because it may take several weeks to see the full effects of this medicine, it is important to continue your treatment as prescribed by your doctor. Patients and their families should watch out for new or worsening thoughts of suicide or depression. Also watch out for sudden changes in feelings such as feeling anxious, agitated, panicky, irritable, hostile, aggressive, impulsive, severely restless, overly excited and hyperactive, or not being able to sleep. If this happens, especially  at the beginning of treatment or after a change in dose, call your health care professional. [name] may get drowsy or dizzy. Do not drive, use machinery, or do anything that needs mental alertness until you know how this medicine  affects you. Do not stand or sit up quickly, especially if you are an older patient. This reduces the risk of dizzy or fainting spells. Alcohol may interfere with the effect of this medicine. Avoid alcoholic drinks. Your mouth may get dry. Chewing sugarless gum or sucking hard Karene, and drinking plenty of water may help. Contact your doctor if the problem does not go away or is severe. This medicine may affect blood sugar levels. If you have diabetes, check with your doctor or health care professional before you change your diet or the dose of your diabetic medicine. What side effects may I notice from receiving this medicine? Side effects that you should report to your doctor or health care professional as soon as possible: -allergic reactions like skin rash, itching or hives, swelling of the face, lips, or tongue -anxious -black, tarry stools -breathing problems -changes in vision -confusion -elevated mood, decreased need for sleep, racing thoughts, impulsive behavior -eye pain -fast, irregular heartbeat -feeling faint or lightheaded, falls -feeling agitated, angry, or irritable -hallucination, loss of contact with reality -loss of balance or coordination -loss of memory -painful or prolonged erections -restlessness, pacing, inability to keep still -seizures -stiff muscles -suicidal thoughts or other mood changes -trouble sleeping -unusual bleeding or bruising -unusually weak or tired -vomiting Side effects that usually do not require medical attention (report to your doctor or health care professional if they continue or are bothersome): -change in appetite or weight -change in sex drive or performance -diarrhea -dry mouth -headache -increased sweating -nausea -tremors This list may not describe all possible side effects. Call your doctor for medical advice about side effects. You may report side effects to FDA at 1-800-FDA-1088. Where should I keep my medicine? Keep out of  the reach of children. Store at room temperature between 15 and 30 degrees C (59 and 86 degrees F). Throw away any unused medicine after the expiration date. NOTE: This sheet is a summary. It may not cover all possible information. If you have questions about this medicine, talk to your doctor, pharmacist, or health care provider.  2018 Elsevier/Gold Standard (2016-04-15 15:55:27)    Lucretia Kern, DO

## 2018-06-18 NOTE — Telephone Encounter (Signed)
Patient seen in the office today by Dr Maudie Mercury and I advised her we cannot send information without a signed release from her.

## 2018-07-21 NOTE — Progress Notes (Signed)
HPI:  Using dictation device. Unfortunately this device frequently misinterprets words/phrases.   Destiny Robbins is a pleasant 72 y.o. here for follow up. Chronic medical problems summarized below were reviewed for changes. Denies CP, SOB, DOE, treatment intolerance or new symptoms.  Follow up: Lance Sell for [facility]. Due for labs, DEXA, flu vaccine, mammo - refused all preventive measures and labs. Reports understands implications and just does not feel she wants to live to be 89, so prefers not to do these. She understands risks/benefits.  Anxiety and Depression -per report treated by her gynecologist in the past and tried a number of medications: effexor, xanax, lexapro (? Caused diarrhea), paxil, wellbutrin -seeing counselor whom recommended prozac and she wanted to try it at visit 05/2018 -today reports: doing well today, no anxiety or depression, hot flashes improved some, but wants to try a little higher dose to see if can improve more -See PHQ9 -mild GI upset with the prozac, but improed -report history of taking several pills from a friend to "feel numb"; she has refused seeing psychiatry as feels symptoms since not severe  Hx hyperglycemia: -chronic -lifestyle changes advised -refuses labs today, does not mind if gets diabetes and does not know, but feel is healthy -stable in terms of symptoms, none  Hx vit D def: -chronic -supplementation advised -does not want to recheck -refuses bone density -stable in terms of reported symptoms, none  ROS: See pertinent positives and negatives per HPI.  Past Medical History:  Diagnosis Date  . Anxiety   . Chicken pox   . Colon polyp   . UTI (urinary tract infection)     Past Surgical History:  Procedure Laterality Date  . BREAST ENHANCEMENT SURGERY    . BREAST IMPLANT REMOVAL    . EXCISION MORTON'S NEUROMA    . TOTAL ABDOMINAL HYSTERECTOMY  2001    History reviewed. No pertinent family history.  SOCIAL HX: see  hpi   Current Outpatient Medications:  .  FLUoxetine (PROZAC) 10 MG capsule, Take 1 capsule (10 mg total) by mouth daily., Disp: 30 capsule, Rfl: 3  EXAM:  Vitals:   07/22/18 1052  BP: 138/78  Pulse: 72  Temp: 98.1 F (36.7 C)    Body mass index is 21.94 kg/m.  GENERAL: vitals reviewed and listed above, alert, oriented, appears well hydrated and in no acute distress  HEENT: atraumatic, conjunttiva clear, no obvious abnormalities on inspection of external nose and ears  NECK: no obvious masses on inspection  LUNGS: clear to auscultation bilaterally, no wheezes, rales or rhonchi, good air movement  CV: HRRR, no peripheral edema  MS: moves all extremities without noticeable abnormality  PSYCH: pleasant and cooperative, no obvious depression or anxiety  ASSESSMENT AND PLAN:  Discussed the following assessment and plan:  Hot flashes  Recurrent major depressive disorder, in full remission ([place])  Hyperglycemia  Vitamin D deficiency  -she wants to try a slightly higher dose of the prozac to see if gets further improvement in hot flashes, warned may increase GI side effects, but she wishes to give it a try and go back to lower dose. She denies any mood symptoms currently. -she declines recheck on blood sugar or vit D and also all preventive measures - discussed risks/benefits and she continues to decline. She knows she can let us know if changes her mind. Recommended healthy lifestyle and vit D3 supplementation per current recommendations. -follow up 3-4 months, sooner as needed -Patient advised to return or notify a doctor immediately  if symptoms worsen or persist or new concerns arise.  Patient Instructions  BEFORE YOU LEAVE: -PHQ9 in Epic -follow up: in 3-4 months  Can try 20mg  daily of the prozac. Back back down to 10mg  daily if any side effects or do not notice further improvement.  Continue counseling.  You declined bone density check, labs, cancer screenings  and vaccines. Please let us know if you change you mind regarding any of these.  Take vit D3 [phone] IU daily.  We recommend the following healthy lifestyle for LIFE: 1) Small portions. But, make sure to get regular (at least 3 per day), healthy meals and small healthy snacks if needed.  2) Eat a healthy clean diet.   TRY TO EAT: -at least 5-7 servings of low sugar, colorful, and nutrient rich vegetables per day (not corn, potatoes or bananas.) -berries are the best choice if you wish to eat fruit (only eat small amounts if trying to reduce weight)  -lean meets (fish, white meat of chicken or [place]) -vegan proteins for some meals - beans or tofu, whole grains, nuts and seeds -Replace bad fats with good fats - good fats include: fish, nuts and seeds, canola oil, olive oil -small amounts of low fat or non fat dairy -small amounts of100 % whole grains - check the lables -drink plenty of water  AVOID: -SUGAR, sweets, anything with added sugar, corn syrup or sweeteners - must read labels as even foods advertised as "healthy" often are loaded with sugar -if you must have a sweetener, small amounts of stevia may be best -sweetened beverages and artificially sweetened beverages -simple starches (rice, bread, potatoes, pasta, chips, etc - small amounts of 100% whole grains are ok) -red meat, pork, butter -fried foods, fast food, processed food, excessive dairy, eggs and coconut.  3)Get at least 150 minutes of sweaty aerobic exercise per week.  4)Reduce stress - consider counseling, meditation and relaxation to balance other aspects of your life.     Lucretia Kern, DO

## 2018-07-21 NOTE — Progress Notes (Signed)
Subjective:   Destiny Robbins is a 72 y.o. female who presents for Medicare Annual (Subsequent) preventive examination.  Reports health as good  Has 2 children Good genetics    Diet BMI 22  A1c 6.0 Breakfast bacon and eggs; yogurt and fruit Left overs [employer] lot of junk food; chips  Choose lesser snacks   Exercise Walks the dog 40 minutes a day   Health Maintenance Due  Topic Date Due  . INFLUENZA VACCINE  06/27/2018   Mammogram 11/2015- declines repeat Does SBE   Bone density declines   Cologuard 02/2017  Educated regarding shingrix  Declines flu vaccine  Cardiac Risk Factors include: advanced age (>51men, >66 women);family history of premature cardiovascular disease     Objective:     Vitals: BP 138/78   Pulse 72   Ht 5' 5.4" (1.661 m)   Wt 133 lb 8 oz (60.6 kg)   SpO2 95%   BMI 21.94 kg/m   Body mass index is 21.94 kg/m.  Advanced Directives 07/22/2018 12/13/2015 11/10/2015 11/04/2015 11/01/2015 12/09/2014  Does Patient Have a Medical Advance Directive? Yes Yes Yes Yes Yes Yes  Type of Advance Directive - [place];Living will [place];Living will [place];Living will [place];Living will [place];Living will  Does patient want to make changes to medical advance directive? - No - Patient declined - - No - Patient declined No - Patient declined  Copy of [place] in Chart? - Yes Yes Yes Yes No - copy requested    Tobacco Social History   Tobacco Use  Smoking Status Never Smoker  Smokeless Tobacco Never Used     Counseling given: Yes   Clinical Intake:      Past Medical History:  Diagnosis Date  . Anxiety   . Chicken pox   . Colon polyp   . UTI (urinary tract infection)    Past Surgical History:  Procedure Laterality Date  . BREAST ENHANCEMENT SURGERY    . BREAST IMPLANT REMOVAL    . EXCISION MORTON'S  NEUROMA    . TOTAL ABDOMINAL HYSTERECTOMY  2001   No family history on file. Social History   Socioeconomic History  . Marital status: Unknown    Spouse name: Not on file  . Number of children: Not on file  . Years of education: Not on file  . Highest education level: Not on file  Occupational History  . Not on file  Social Needs  . Financial resource strain: Not on file  . Food insecurity:    Worry: Not on file    Inability: Not on file  . Transportation needs:    Medical: Not on file    Non-medical: Not on file  Tobacco Use  . Smoking status: Never Smoker  . Smokeless tobacco: Never Used  Substance and Sexual Activity  . Alcohol use: Yes    Alcohol/week: 0.0 standard drinks    Comment: occ; about 4 times per year   . Drug use: No  . Sexual activity: Not on file  Lifestyle  . Physical activity:    Days per week: Not on file    Minutes per session: Not on file  . Stress: Not on file  Relationships  . Social connections:    Talks on phone: Not on file    Gets together: Not on file    Attends religious service: Not on file  Active member of club or organization: Not on file    Attends meetings of clubs or organizations: Not on file    Relationship status: Not on file  Other Topics Concern  . 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 FLUoxetine (PROZAC) 10 MG capsule Take 1 capsule (10 mg total) by mouth daily.   No facility-administered encounter medications on file as of 07/22/2018.     Activities of Daily Living In your present state of health, do you have any difficulty performing the following activities: 07/22/2018  Hearing? N  Vision?  N  Difficulty concentrating or making decisions? N  Walking or climbing stairs? N  Dressing or bathing? N  Doing errands, shopping? N  Preparing Food and eating ? N  Using the Toilet? N  In the past six months, have you accidently leaked urine? N  Do you have problems with loss of bowel control? N  Managing your Medications? N  Managing your Finances? N  Housekeeping or managing your Housekeeping? N  Some recent data might be hidden    Patient Care Team: Lucretia Kern, DO as PCP - General (Family Medicine)    Assessment:   This is a routine wellness examination for Destiny Robbins.  Exercise Activities and Dietary recommendations Current Exercise Habits: Home exercise routine  Goals    . Patient Stated     Complete task already on the agenda        Fall Risk Fall Risk  07/22/2018 03/06/2017 12/13/2015 12/13/2015 12/09/2014  Falls in the past year? No No No No No  Number falls in past yr: - - - - -  Risk for fall due to : - - - - -     Depression Screen PHQ 2/9 Scores 07/22/2018 03/06/2017 12/13/2015 12/09/2014  PHQ - 2 Score 1 0 0 0  PHQ- 9 Score - - - 0     Cognitive Function MMSE - Mini Mental State Exam 07/22/2018  Not completed: (No Data)     Ad8 score reviewed for issues:  Issues making decisions:  Less interest in hobbies / activities:  Repeats questions, stories (family complaining):  Trouble using ordinary gadgets (microwave, computer, phone):  Forgets the month or year:   Mismanaging finances:   Remembering appts:  Daily problems with thinking and/or memory: Ad8 score is=0         Immunization History  Administered Date(s) Administered  . Influenza, High Dose Seasonal PF 10/28/2015, 08/08/2016  . Influenza,inj,Quad PF,6+ Mos 12/17/2013  . Influenza-Unspecified 08/30/2014  . Pneumococcal Conjugate-13 12/17/2013  . Pneumococcal Polysaccharide-23 12/09/2014  . Tdap 05/27/2012  . Zoster 04/27/2009      Screening Tests Health Maintenance  Topic  Date Due  . INFLUENZA VACCINE  06/27/2018  . MAMMOGRAM  09/18/2018 (Originally 12/12/2017)  . Hepatitis C Screening  06/19/2019 (Originally 1946/11/27)  . DEXA SCAN  07/23/2019 (Originally 09/16/2013)  . Fecal DNA (Cologuard)  03/12/2020  . TETANUS/TDAP  05/27/2022  . PNA vac Low Risk Adult  Completed         Plan:  PCP Notes   Health Maintenance Mammogram 11/2015- declines repeat Does SBE  (did have breast implant and removed in hx)   Bone density declines   Cologuard 02/2017  Educated regarding shingrix  Declines flu vaccine  A1c 6.0 and did educate on prediabetes   Abnormal Screens  none  Referrals  none  Patient concerns; Here to discuss medication with Dr. Maudie Mercury for depression  Nurse Concerns; As noted; affect flat  Next PCP apt today     I have personally reviewed and noted the following in the patient's chart:   . Medical and social history . Use of alcohol, tobacco or illicit drugs  . Current medications and supplements . Functional ability and status . Nutritional status . Physical activity . Advanced directives . List of other physicians . Hospitalizations, surgeries, and ER visits in previous 12 months . Vitals . Screenings to include cognitive, depression, and falls . Referrals and appointments  In addition, I have reviewed and discussed with patient certain preventive protocols, quality metrics, and best practice recommendations. A written personalized care plan for preventive services as well as general preventive health recommendations were provided to patient.     Wynetta Fines, RN  07/22/2018

## 2018-07-22 ENCOUNTER — Encounter: Payer: Self-pay | Admitting: Family Medicine

## 2018-07-22 ENCOUNTER — Ambulatory Visit (INDEPENDENT_AMBULATORY_CARE_PROVIDER_SITE_OTHER): Payer: Medicare HMO | Admitting: Family Medicine

## 2018-07-22 ENCOUNTER — Ambulatory Visit (INDEPENDENT_AMBULATORY_CARE_PROVIDER_SITE_OTHER): Payer: Medicare HMO

## 2018-07-22 VITALS — BP 138/78 | HR 72 | Ht 65.4 in | Wt 133.5 lb

## 2018-07-22 VITALS — BP 138/78 | HR 72 | Temp 98.1°F | Ht 65.4 in | Wt 133.5 lb

## 2018-07-22 DIAGNOSIS — Z0000 Encounter for general adult medical examination without abnormal findings: Secondary | ICD-10-CM

## 2018-07-22 DIAGNOSIS — E559 Vitamin D deficiency, unspecified: Secondary | ICD-10-CM

## 2018-07-22 DIAGNOSIS — F3342 Major depressive disorder, recurrent, in full remission: Secondary | ICD-10-CM

## 2018-07-22 DIAGNOSIS — R69 Illness, unspecified: Secondary | ICD-10-CM | POA: Diagnosis not present

## 2018-07-22 DIAGNOSIS — R232 Flushing: Secondary | ICD-10-CM

## 2018-07-22 DIAGNOSIS — R739 Hyperglycemia, unspecified: Secondary | ICD-10-CM | POA: Diagnosis not present

## 2018-07-22 NOTE — Patient Instructions (Addendum)
BEFORE YOU LEAVE: -PHQ9 in Epic -follow up: in 3-4 months  Can try 20mg  daily of the prozac. Back back down to 10mg  daily if any side effects or do not notice further improvement.  Continue counseling.  You declined bone density check, labs, cancer screenings and vaccines. Please let us know if you change you mind regarding any of these.  Take vit D3 [id number] IU daily.  We recommend the following healthy lifestyle for LIFE: 1) Small portions. But, make sure to get regular (at least 3 per day), healthy meals and small healthy snacks if needed.  2) Eat a healthy clean diet.   TRY TO EAT: -at least 5-7 servings of low sugar, colorful, and nutrient rich vegetables per day (not corn, potatoes or bananas.) -berries are the best choice if you wish to eat fruit (only eat small amounts if trying to reduce weight)  -lean meets (fish, white meat of chicken or [place]) -vegan proteins for some meals - beans or tofu, whole grains, nuts and seeds -Replace bad fats with good fats - good fats include: fish, nuts and seeds, canola oil, olive oil -small amounts of low fat or non fat dairy -small amounts of100 % whole grains - check the lables -drink plenty of water  AVOID: -SUGAR, sweets, anything with added sugar, corn syrup or sweeteners - must read labels as even foods advertised as "healthy" often are loaded with sugar -if you must have a sweetener, small amounts of stevia may be best -sweetened beverages and artificially sweetened beverages -simple starches (rice, bread, potatoes, pasta, chips, etc - small amounts of 100% whole grains are ok) -red meat, pork, butter -fried foods, fast food, processed food, excessive dairy, eggs and coconut.  3)Get at least 150 minutes of sweaty aerobic exercise per week.  4)Reduce stress - consider counseling, meditation and relaxation to balance other aspects of your life.

## 2018-07-22 NOTE — Progress Notes (Signed)
Aran Menning R Geovanny Sartin, DO  

## 2018-07-22 NOTE — Patient Instructions (Addendum)
Destiny Robbins , Thank you for taking time to come for your Medicare Wellness Visit. I appreciate your ongoing commitment to your health goals. Please review the following plan we discussed and let me know if I can assist you in the future.   Keep in mind the flu shot is an inactivated vaccine and takes at least 2 weeks to build immunity. The flu virus can be dormant for 4 days prior to symptoms Taking the flu shot at the beginning of the season can reduce the risk for the entire community.    Recommendations for Dexa Scan Female over the age of 51 Man age 54 or older If you broke a bone past the age of 26 Women menopausal age with risk factors (thin frame; smoker; hx of fx ) Post menopausal women under the age of 25 with risk factors A man age 33 to 4 with risk factors Other: Spine xray that is showing break of bone loss Back pain with possible break Height loss of 1/2 inch or more within one year Total loss in height of 1.5 inches from your original height  Calcium 1280m with Vit D 800u per day; more as directed by physician Strength building exercises discussed; can include walking; housework; small weights or stretch bands; silver sneakers if access to the [facility]  Please visit the osteoporosis foundation.org for up to date recommendations  Shingrix is a vaccine for the prevention of Shingles in Adults 50 and older.  If you are on Medicare, the shingrix is covered under your Part D plan, so you will take both of the vaccines in the series at your pharmacy. Please check with your benefits regarding applicable copays or out of pocket expenses.  The Shingrix is given in 2 vaccines approx 8 weeks apart. You must receive the 2nd dose prior to 6 months from receipt of the first. Please have the pharmacist print out you Immunization  dates for our office records    These are the goals we discussed: Goals    . Patient Stated     Complete task already on the agenda        This is a list of  the screening recommended for you and due dates:  Health Maintenance  Topic Date Due  . DEXA scan (bone density measurement)  09/16/2013  . Flu Shot  06/27/2018  . Mammogram  09/18/2018*  .  Hepatitis C: One time screening is recommended by Center for Disease Control  (CDC) for  adults born from [birth year]through 1965.   06/19/2019*  . Cologuard (Stool DNA test)  03/12/2020  . Tetanus Vaccine  05/27/2022  . Pneumonia vaccines  Completed  *Topic was postponed. The date shown is not the original due date.      Fall Prevention in the Home Falls can cause injuries. They can happen to people of all ages. There are many things you can do to make your home safe and to help prevent falls. What can I do on the outside of my home?  Regularly fix the edges of walkways and driveways and fix any cracks.  Remove anything that might make you trip as you walk through a door, such as a raised step or threshold.  Trim any bushes or trees on the path to your home.  Use bright outdoor lighting.  Clear any walking paths of anything that might make someone trip, such as rocks or tools.  Regularly check to see if handrails are loose or broken. Make sure that  both sides of any steps have handrails.  Any raised decks and porches should have guardrails on the edges.  Have any leaves, snow, or ice cleared regularly.  Use sand or salt on walking paths during winter.  Clean up any spills in your garage right away. This includes oil or grease spills. What can I do in the bathroom?  Use night lights.  Install grab bars by the toilet and in the tub and shower. Do not use towel bars as grab bars.  Use non-skid mats or decals in the tub or shower.  If you need to sit down in the shower, use a plastic, non-slip stool.  Keep the floor dry. Clean up any water that spills on the floor as soon as it happens.  Remove soap buildup in the tub or shower regularly.  Attach bath mats securely with double-sided  non-slip rug tape.  Do not have throw rugs and other things on the floor that can make you trip. What can I do in the bedroom?  Use night lights.  Make sure that you have a light by your bed that is easy to reach.  Do not use any sheets or blankets that are too big for your bed. They should not hang down onto the floor.  Have a firm chair that has side arms. You can use this for support while you get dressed.  Do not have throw rugs and other things on the floor that can make you trip. What can I do in the kitchen?  Clean up any spills right away.  Avoid walking on wet floors.  Keep items that you use a lot in easy-to-reach places.  If you need to reach something above you, use a strong step stool that has a grab bar.  Keep electrical cords out of the way.  Do not use floor polish or wax that makes floors slippery. If you must use wax, use non-skid floor wax.  Do not have throw rugs and other things on the floor that can make you trip. What can I do with my stairs?  Do not leave any items on the stairs.  Make sure that there are handrails on both sides of the stairs and use them. Fix handrails that are broken or loose. Make sure that handrails are as long as the stairways.  Check any carpeting to make sure that it is firmly attached to the stairs. Fix any carpet that is loose or worn.  Avoid having throw rugs at the top or bottom of the stairs. If you do have throw rugs, attach them to the floor with carpet tape.  Make sure that you have a light switch at the top of the stairs and the bottom of the stairs. If you do not have them, ask someone to add them for you. What else can I do to help prevent falls?  Wear shoes that: ? Do not have high heels. ? Have rubber bottoms. ? Are comfortable and fit you well. ? Are closed at the toe. Do not wear sandals.  If you use a stepladder: ? Make sure that it is fully opened. Do not climb a closed stepladder. ? Make sure that both  sides of the stepladder are locked into place. ? Ask someone to hold it for you, if possible.  Clearly mark and make sure that you can see: ? Any grab bars or handrails. ? First and last steps. ? Where the edge of each step is.  Use tools  that help you move around (mobility aids) if they are needed. These include: ? Canes. ? Walkers. ? Scooters. ? Crutches.  Turn on the lights when you go into a dark area. Replace any light bulbs as soon as they burn out.  Set up your furniture so you have a clear path. Avoid moving your furniture around.  If any of your floors are uneven, fix them.  If there are any pets around you, be aware of where they are.  Review your medicines with your doctor. Some medicines can make you feel dizzy. This can increase your chance of falling. Ask your doctor what other things that you can do to help prevent falls. This information is not intended to replace advice given to you by your health care provider. Make sure you discuss any questions you have with your health care provider. Document Released: 09/09/2009 Document Revised: 04/20/2016 Document Reviewed: 12/18/2014 Elsevier Interactive Patient Education  2018 [place] Maintenance, Female Adopting a healthy lifestyle and getting preventive care can go a long way to promote health and wellness. Talk with your health care provider about what schedule of regular examinations is right for you. This is a good chance for you to check in with your provider about disease prevention and staying healthy. In between checkups, there are plenty of things you can do on your own. Experts have done a lot of research about which lifestyle changes and preventive measures are most likely to keep you healthy. Ask your health care provider for more information. Weight and diet Eat a healthy diet  Be sure to include plenty of vegetables, fruits, low-fat dairy products, and lean protein.  Do not eat a lot of foods  high in solid fats, added sugars, or salt.  Get regular exercise. This is one of the most important things you can do for your health. ? Most adults should exercise for at least 150 minutes each week. The exercise should increase your heart rate and make you sweat (moderate-intensity exercise). ? Most adults should also do strengthening exercises at least twice a week. This is in addition to the moderate-intensity exercise.  Maintain a healthy weight  Body mass index (BMI) is a measurement that can be used to identify possible weight problems. It estimates body fat based on height and weight. Your health care provider can help determine your BMI and help you achieve or maintain a healthy weight.  For females 48 years of age and older: ? A BMI below 18.5 is considered underweight. ? A BMI of 18.5 to 24.9 is normal. ? A BMI of 25 to 29.9 is considered overweight. ? A BMI of 30 and above is considered obese.  Watch levels of cholesterol and blood lipids  You should start having your blood tested for lipids and cholesterol at 70 years of age, then have this test every 5 years.  You may need to have your cholesterol levels checked more often if: ? Your lipid or cholesterol levels are high. ? You are older than 70 years of age. ? You are at high risk for heart disease.  Cancer screening Lung Cancer  Lung cancer screening is recommended for adults 29-5 years old who are at high risk for lung cancer because of a history of smoking.  A yearly low-dose CT scan of the lungs is recommended for people who: ? Currently smoke. ? Have quit within the past 15 years. ? Have at least a 30-pack-year history of smoking. A pack year  is smoking an average of one pack of cigarettes a day for 1 year.  Yearly screening should continue until it has been 15 years since you quit.  Yearly screening should stop if you develop a health problem that would prevent you from having lung cancer treatment.  Breast  Cancer  Practice breast self-awareness. This means understanding how your breasts normally appear and feel.  It also means doing regular breast self-exams. Let your health care provider know about any changes, no matter how small.  If you are in your 20s or 30s, you should have a clinical breast exam (CBE) by a health care provider every 1-3 years as part of a regular health exam.  If you are 72 or older, have a CBE every year. Also consider having a breast X-ray (mammogram) every year.  If you have a family history of breast cancer, talk to your health care provider about genetic screening.  If you are at high risk for breast cancer, talk to your health care provider about having an MRI and a mammogram every year.  Breast cancer gene (BRCA) assessment is recommended for women who have family members with BRCA-related cancers. BRCA-related cancers include: ? Breast. ? Ovarian. ? Tubal. ? Peritoneal cancers.  Results of the assessment will determine the need for genetic counseling and BRCA1 and BRCA2 testing.  Cervical Cancer Your health care provider may recommend that you be screened regularly for cancer of the pelvic organs (ovaries, uterus, and vagina). This screening involves a pelvic examination, including checking for microscopic changes to the surface of your cervix (Pap test). You may be encouraged to have this screening done every 3 years, beginning at age 53.  For women ages 47-65, health care providers may recommend pelvic exams and Pap testing every 3 years, or they may recommend the Pap and pelvic exam, combined with testing for human papilloma virus (HPV), every 5 years. Some types of HPV increase your risk of cervical cancer. Testing for HPV may also be done on women of any age with unclear Pap test results.  Other health care providers may not recommend any screening for nonpregnant women who are considered low risk for pelvic cancer and who do not have symptoms. Ask your  health care provider if a screening pelvic exam is right for you.  If you have had past treatment for cervical cancer or a condition that could lead to cancer, you need Pap tests and screening for cancer for at least 20 years after your treatment. If Pap tests have been discontinued, your risk factors (such as having a new sexual partner) need to be reassessed to determine if screening should resume. Some women have medical problems that increase the chance of getting cervical cancer. In these cases, your health care provider may recommend more frequent screening and Pap tests.  Colorectal Cancer  This type of cancer can be detected and often prevented.  Routine colorectal cancer screening usually begins at 71 years of age and continues through 70 years of age.  Your health care provider may recommend screening at an earlier age if you have risk factors for colon cancer.  Your health care provider may also recommend using home test kits to check for hidden blood in the stool.  A small camera at the end of a tube can be used to examine your colon directly (sigmoidoscopy or colonoscopy). This is done to check for the earliest forms of colorectal cancer.  Routine screening usually begins at age 29.  Direct  examination of the colon should be repeated every 5-10 years through 70 years of age. However, you may need to be screened more often if early forms of precancerous polyps or small growths are found.  Skin Cancer  Check your skin from head to toe regularly.  Tell your health care provider about any new moles or changes in moles, especially if there is a change in a mole's shape or color.  Also tell your health care provider if you have a mole that is larger than the size of a pencil eraser.  Always use sunscreen. Apply sunscreen liberally and repeatedly throughout the day.  Protect yourself by wearing long sleeves, pants, a wide-brimmed hat, and sunglasses whenever you are  outside.  Heart disease, diabetes, and high blood pressure  High blood pressure causes heart disease and increases the risk of stroke. High blood pressure is more likely to develop in: ? People who have blood pressure in the high end of the normal range (130-139/85-89 mm Hg). ? People who are overweight or obese. ? People who are African American.  If you are 104-20 years of age, have your blood pressure checked every 3-5 years. If you are 64 years of age or older, have your blood pressure checked every year. You should have your blood pressure measured twice-once when you are at a hospital or clinic, and once when you are not at a hospital or clinic. Record the average of the two measurements. To check your blood pressure when you are not at a hospital or clinic, you can use: ? An automated blood pressure machine at a pharmacy. ? A home blood pressure monitor.  If you are between 109 years and 63 years old, ask your health care provider if you should take aspirin to prevent strokes.  Have regular diabetes screenings. This involves taking a blood sample to check your fasting blood sugar level. ? If you are at a normal weight and have a low risk for diabetes, have this test once every three years after 70 years of age. ? If you are overweight and have a high risk for diabetes, consider being tested at a younger age or more often. Preventing infection Hepatitis B  If you have a higher risk for hepatitis B, you should be screened for this virus. You are considered at high risk for hepatitis B if: ? You were born in a country where hepatitis B is common. Ask your health care provider which countries are considered high risk. ? Your parents were born in a high-risk country, and you have not been immunized against hepatitis B (hepatitis B vaccine). ? You have HIV or AIDS. ? You use needles to inject street drugs. ? You live with someone who has hepatitis B. ? You have had sex with someone who has  hepatitis B. ? You get hemodialysis treatment. ? You take certain medicines for conditions, including cancer, organ transplantation, and autoimmune conditions.  Hepatitis C  Blood testing is recommended for: ? Everyone born from [birth year] through 1963. ? Anyone with known risk factors for hepatitis C.  Sexually transmitted infections (STIs)  You should be screened for sexually transmitted infections (STIs) including gonorrhea and chlamydia if: ? You are sexually active and are younger than 70 years of age. ? You are older than 70 years of age and your health care provider tells you that you are at risk for this type of infection. ? Your sexual activity has changed since you were last screened and you  are at an increased risk for chlamydia or gonorrhea. Ask your health care provider if you are at risk.  If you do not have HIV, but are at risk, it may be recommended that you take a prescription medicine daily to prevent HIV infection. This is called pre-exposure prophylaxis (PrEP). You are considered at risk if: ? You are sexually active and do not regularly use condoms or know the HIV status of your partner(s). ? You take drugs by injection. ? You are sexually active with a partner who has HIV.  Talk with your health care provider about whether you are at high risk of being infected with HIV. If you choose to begin PrEP, you should first be tested for HIV. You should then be tested every 3 months for as long as you are taking PrEP. Pregnancy  If you are premenopausal and you may become pregnant, ask your health care provider about preconception counseling.  If you may become pregnant, take 400 to 800 micrograms (mcg) of folic acid every day.  If you want to prevent pregnancy, talk to your health care provider about birth control (contraception). Osteoporosis and menopause  Osteoporosis is a disease in which the bones lose minerals and strength with aging. This can result in serious bone  fractures. Your risk for osteoporosis can be identified using a bone density scan.  If you are 17 years of age or older, or if you are at risk for osteoporosis and fractures, ask your health care provider if you should be screened.  Ask your health care provider whether you should take a calcium or vitamin D supplement to lower your risk for osteoporosis.  Menopause may have certain physical symptoms and risks.  Hormone replacement therapy may reduce some of these symptoms and risks. Talk to your health care provider about whether hormone replacement therapy is right for you. Follow these instructions at home:  Schedule regular health, dental, and eye exams.  Stay current with your immunizations.  Do not use any tobacco products including cigarettes, chewing tobacco, or electronic cigarettes.  If you are pregnant, do not drink alcohol.  If you are breastfeeding, limit how much and how often you drink alcohol.  Limit alcohol intake to no more than 1 drink per day for nonpregnant women. One drink equals 12 ounces of beer, 5 ounces of wine, or 1 ounces of hard liquor.  Do not use street drugs.  Do not share needles.  Ask your health care provider for help if you need support or information about quitting drugs.  Tell your health care provider if you often feel depressed.  Tell your health care provider if you have ever been abused or do not feel safe at home. This information is not intended to replace advice given to you by your health care provider. Make sure you discuss any questions you have with your health care provider. Document Released: 05/29/2011 Document Revised: 04/20/2016 Document Reviewed: 08/17/2015 Elsevier Interactive Patient Education  [employer].

## 2018-07-23 ENCOUNTER — Telehealth: Payer: Self-pay | Admitting: Family Medicine

## 2018-07-23 ENCOUNTER — Other Ambulatory Visit: Payer: Self-pay | Admitting: Family Medicine

## 2018-07-23 MED ORDER — FLUOXETINE HCL 10 MG PO CAPS: 20.0000 mg | ORAL_CAPSULE | Freq: Every day | ORAL | 3 refills | Status: DC

## 2018-07-23 NOTE — Telephone Encounter (Signed)
Copied from [place] [phone]. Topic: Quick Communication - Rx Refill/Question >> Jul 23, 2018 12:08 PM Oliver Pila B wrote: Medication: FLUoxetine (PROZAC) 10 MG capsule [[id number]] (60 capsules)  Has the patient contacted their pharmacy? Yes.   (Agent: If no, request that the patient contact the pharmacy for the refill.) (Agent: If yes, when and what did the pharmacy advise?)  Preferred Pharmacy (with phone number or street name): [facility] [place]  Agent: Please be advised that RX refills may take up to 3 business days. We ask that you follow-up with your pharmacy.

## 2018-08-29 ENCOUNTER — Ambulatory Visit: Payer: Self-pay | Admitting: Family Medicine

## 2018-10-20 NOTE — Progress Notes (Signed)
HPI:  Using dictation device. Unfortunately this device frequently misinterprets words/phrases.  Destiny Robbins is a pleasant 72 y.o. here for follow up. Chronic medical problems summarized below were reviewed for changes. Reports is doing well but wants refill on SSRI. Reports mild fatigue but otherwise symptoms well controlled. She is also taking the SSRI for hot flashes and they have improved significantly, but she wants to try a higher dose of the prozac to see if helps further. Poorly compliant with care recommendations. Has refused a number of recommendations and preventive care measures om the past. Risks of declining have been thoroughly outlined.  Denies CP, SOB, DOE, treatment intolerance or new symptoms. Due for mammogram. Refused in the past; after lengthy discussion today she finally agrees to do. AWV 07/22/18  Anxiety and Depression/Hot flashes: -today reports: doing well - see above -denies:depression, SI, thoughts of harm - see phq9 -Prior history: -per report treated by her gynecologist in the past and tried a number of medications: effexor, xanax, lexapro (? Caused diarrhea), paxil, wellbutrin -seeing counselor whom recommended prozac and she wanted to try it at visit 05/2018 -mild GI upset with the prozac, but improved -history of taking several pills from a friend to "feel numb"; she has refused seeing psychiatry as feels symptoms since not severe  Hx hyperglycemia: -chronic -lifestyle changes advised -refused follow up labs -stable in terms of symptoms, none  Hx vit D def: -chronic -supplementation advised -does not want to recheck -refused bone density -stable in terms of reported symptoms, none  ROS: See pertinent positives and negatives per HPI.  Past Medical History:  Diagnosis Date  . Anxiety   . Chicken pox   . Colon polyp   . UTI (urinary tract infection)     Past Surgical History:  Procedure Laterality Date  . BREAST ENHANCEMENT SURGERY    .  BREAST IMPLANT REMOVAL    . EXCISION MORTON'S NEUROMA    . TOTAL ABDOMINAL HYSTERECTOMY  2001    History reviewed. No pertinent family history.  SOCIAL HX: see hpi   Current Outpatient Medications:  .  FLUoxetine (PROZAC) 40 MG capsule, Take 1 capsule (40 mg total) by mouth daily., Disp: 90 capsule, Rfl: 1  EXAM:  Vitals:   10/22/18 0923  BP: 120/60  Pulse: 72  Temp: 98.2 F (36.8 C)    Body mass index is 21.94 kg/m.  GENERAL: vitals reviewed and listed above, alert, oriented, appears well hydrated and in no acute distress  HEENT: atraumatic, conjunttiva clear, no obvious abnormalities on inspection of external nose and ears  NECK: no obvious masses on inspection  LUNGS: clear to auscultation bilaterally, no wheezes, rales or rhonchi, good air movement  CV: HRRR, no peripheral edema  MS: moves all extremities without noticeable abnormality  PSYCH: pleasant and cooperative, no obvious depression or anxiety  ASSESSMENT AND PLAN:  Discussed the following assessment and plan:  Anxiety and depression  Hot flashes  Breast cancer screening  -discussed risks/benefits of the medication and increased dose. She wants to increase dose.  -discussed breast cancer screening again and she changed her mind and agreed to consider mammogram today - has number to call to schedule -Patient advised to return or notify a doctor immediately if symptoms worsen or persist or new concerns arise.  Patient Instructions  BEFORE YOU LEAVE: -follow up: 3 months  Increase the prozac to 40 mg daily  Call today to schedule your mammogram   We recommend the following healthy lifestyle for LIFE: 1)  Small portions. But, make sure to get regular (at least 3 per day), healthy meals and small healthy snacks if needed.  2) Eat a healthy clean diet.   TRY TO EAT: -at least 5-7 servings of low sugar, colorful, and nutrient rich vegetables per day (not corn, potatoes or bananas.) -berries  are the best choice if you wish to eat fruit (only eat small amounts if trying to reduce weight)  -lean meets (fish, white meat of chicken or [place]) -vegan proteins for some meals - beans or tofu, whole grains, nuts and seeds -Replace bad fats with good fats - good fats include: fish, nuts and seeds, canola oil, olive oil -small amounts of low fat or non fat dairy -small amounts of100 % whole grains - check the lables -drink plenty of water  AVOID: -SUGAR, sweets, anything with added sugar, corn syrup or sweeteners - must read labels as even foods advertised as "healthy" often are loaded with sugar -if you must have a sweetener, small amounts of stevia may be best -sweetened beverages and artificially sweetened beverages -simple starches (rice, bread, potatoes, pasta, chips, etc - small amounts of 100% whole grains are ok) -red meat, pork, butter -fried foods, fast food, processed food, excessive dairy, eggs and coconut.  3)Get at least 150 minutes of sweaty aerobic exercise per week.  4)Reduce stress - consider counseling, meditation and relaxation to balance other aspects of your life.     Lucretia Kern, DO

## 2018-10-22 ENCOUNTER — Ambulatory Visit (INDEPENDENT_AMBULATORY_CARE_PROVIDER_SITE_OTHER): Payer: Medicare HMO | Admitting: Family Medicine

## 2018-10-22 ENCOUNTER — Encounter: Payer: Self-pay | Admitting: Family Medicine

## 2018-10-22 VITALS — BP 120/60 | HR 72 | Temp 98.2°F | Ht 65.4 in

## 2018-10-22 DIAGNOSIS — Z1239 Encounter for other screening for malignant neoplasm of breast: Secondary | ICD-10-CM | POA: Diagnosis not present

## 2018-10-22 DIAGNOSIS — R69 Illness, unspecified: Secondary | ICD-10-CM | POA: Diagnosis not present

## 2018-10-22 DIAGNOSIS — F329 Major depressive disorder, single episode, unspecified: Secondary | ICD-10-CM | POA: Diagnosis not present

## 2018-10-22 DIAGNOSIS — F32A Depression, unspecified: Secondary | ICD-10-CM

## 2018-10-22 DIAGNOSIS — R232 Flushing: Secondary | ICD-10-CM | POA: Diagnosis not present

## 2018-10-22 DIAGNOSIS — F419 Anxiety disorder, unspecified: Secondary | ICD-10-CM | POA: Diagnosis not present

## 2018-10-22 MED ORDER — FLUOXETINE HCL 40 MG PO CAPS: 40.0000 mg | ORAL_CAPSULE | Freq: Every day | ORAL | 1 refills | Status: DC

## 2018-10-22 NOTE — Patient Instructions (Signed)
BEFORE YOU LEAVE: -follow up: 3 months  Increase the prozac to 40 mg daily  Call today to schedule your mammogram   We recommend the following healthy lifestyle for LIFE: 1) Small portions. But, make sure to get regular (at least 3 per day), healthy meals and small healthy snacks if needed.  2) Eat a healthy clean diet.   TRY TO EAT: -at least 5-7 servings of low sugar, colorful, and nutrient rich vegetables per day (not corn, potatoes or bananas.) -berries are the best choice if you wish to eat fruit (only eat small amounts if trying to reduce weight)  -lean meets (fish, white meat of chicken or [place]) -vegan proteins for some meals - beans or tofu, whole grains, nuts and seeds -Replace bad fats with good fats - good fats include: fish, nuts and seeds, canola oil, olive oil -small amounts of low fat or non fat dairy -small amounts of100 % whole grains - check the lables -drink plenty of water  AVOID: -SUGAR, sweets, anything with added sugar, corn syrup or sweeteners - must read labels as even foods advertised as "healthy" often are loaded with sugar -if you must have a sweetener, small amounts of stevia may be best -sweetened beverages and artificially sweetened beverages -simple starches (rice, bread, potatoes, pasta, chips, etc - small amounts of 100% whole grains are ok) -red meat, pork, butter -fried foods, fast food, processed food, excessive dairy, eggs and coconut.  3)Get at least 150 minutes of sweaty aerobic exercise per week.  4)Reduce stress - consider counseling, meditation and relaxation to balance other aspects of your life.

## 2018-10-29 ENCOUNTER — Other Ambulatory Visit: Payer: Self-pay | Admitting: Family Medicine

## 2018-10-29 DIAGNOSIS — Z1231 Encounter for screening mammogram for malignant neoplasm of breast: Secondary | ICD-10-CM

## 2018-11-17 ENCOUNTER — Other Ambulatory Visit: Payer: Self-pay | Admitting: Family Medicine

## 2018-11-18 ENCOUNTER — Other Ambulatory Visit: Payer: Self-pay | Admitting: *Deleted

## 2018-11-18 MED ORDER — FLUOXETINE HCL 40 MG PO CAPS: 40.0000 mg | ORAL_CAPSULE | Freq: Every day | ORAL | 1 refills | Status: DC

## 2018-11-18 NOTE — Telephone Encounter (Signed)
Rx done. 

## 2018-12-11 ENCOUNTER — Ambulatory Visit: Payer: Self-pay

## 2019-01-15 ENCOUNTER — Ambulatory Visit
Admission: RE | Admit: 2019-01-15 | Discharge: 2019-01-15 | Disposition: A | Discharge status: Home / Self Care | Payer: Medicare HMO | Source: Ambulatory Visit | Attending: Family Medicine | Admitting: Family Medicine

## 2019-01-15 DIAGNOSIS — Z1231 Encounter for screening mammogram for malignant neoplasm of breast: Secondary | ICD-10-CM | POA: Diagnosis not present

## 2019-01-19 ENCOUNTER — Encounter: Payer: Self-pay | Admitting: Family Medicine

## 2019-01-19 DIAGNOSIS — F419 Anxiety disorder, unspecified: Secondary | ICD-10-CM | POA: Insufficient documentation

## 2019-01-19 DIAGNOSIS — R739 Hyperglycemia, unspecified: Secondary | ICD-10-CM

## 2019-01-19 DIAGNOSIS — E559 Vitamin D deficiency, unspecified: Secondary | ICD-10-CM | POA: Insufficient documentation

## 2019-01-19 HISTORY — DX: Hyperglycemia, unspecified: R73.9

## 2019-01-19 HISTORY — DX: Vitamin D deficiency, unspecified: E55.9

## 2019-01-19 NOTE — Progress Notes (Signed)
  HPI:  Using dictation device. Unfortunately this device frequently misinterprets words/phrases.  Destiny Robbins is a pleasant 73 y.o. here for follow up. Chronic medical problems summarized below were reviewed for changes and stability and were updated as needed below. These issues and their treatment remain stable for the most part. Reports mood has been good on current dose of prozac. No counseling currently. Denies CP, SOB, DOE, treatment intolerance or new symptoms.  AWV was 07/24/18 with [name]  Anxiety and Depression/Hot flashes: -current tx: prozac, CBT in the past -today reports: doing well - see above -denies:depression, SI, thoughts of harm - see phq9 -Prior history: -per report treated by her gynecologist in the past and tried a number of medications: effexor, xanax, lexapro (? Caused diarrhea), paxil, wellbutrin -seeing counselor whom recommended prozac and she wanted to try it at visit 05/2018 -mild GI upset with the prozac, but improved -history of taking several pills from a friend to "feel numb"; she has refused seeing psychiatry as feels symptoms since not severe  Hx hyperglycemia: -chronic -lifestyle changes advised -refused follow up labs in the past -stable in terms of symptoms, none  Hx vit D def: -chronic -supplementation advised -does not want to recheck -refused bone density in the past -stable in terms of reported symptoms, none  ROS: See pertinent positives and negatives per HPI.  Past Medical History:  Diagnosis Date  . Anxiety   . Chicken pox   . Chronic daily headache 10/28/2014  . Colon polyp   . Depression, recurrent ([place]) 10/28/2014  . Hyperglycemia 01/19/2019  . UTI (urinary tract infection)   . Vitamin D deficiency 01/19/2019    Past Surgical History:  Procedure Laterality Date  . BREAST ENHANCEMENT SURGERY    . BREAST IMPLANT REMOVAL    . EXCISION MORTON'S NEUROMA    . TOTAL ABDOMINAL HYSTERECTOMY  2001    History reviewed. No  pertinent family history.  SOCIAL HX: see  h[i   Current Outpatient Medications:  .  FLUoxetine (PROZAC) 40 MG capsule, Take 1 capsule (40 mg total) by mouth daily., Disp: 90 capsule, Rfl: 1  EXAM:  Vitals:   01/21/19 0859  BP: 110/62  Pulse: 77  Temp: 98.2 F (36.8 C)    Body mass index is 21.25 kg/m.  GENERAL: vitals reviewed and listed above, alert, oriented, appears well hydrated and in no acute distress  HEENT: atraumatic, conjunttiva clear, no obvious abnormalities on inspection of external nose and ears  NECK: no obvious masses on inspection  LUNGS: clear to auscultation bilaterally, no wheezes, rales or rhonchi, good air movement  CV: HRRR, no peripheral edema  MS: moves all extremities without noticeable abnormality  PSYCH: pleasant and cooperative, no obvious depression or anxiety  ASSESSMENT AND PLAN:  Discussed the following assessment and plan:  Anxiety  Hyperglycemia  Vitamin D deficiency  -continue current treatment -AWV in the summer, follow up sooner as needed -Patient advised to return or notify a doctor immediately if symptoms worsen or persist or new concerns arise.  Patient Instructions  BEFORE YOU LEAVE: -follow up: AWV with Dr. Maudie Mercury in August or September (1 year form prior)  Continue current treatment.  Follow up sooner as needed   Lucretia Kern, DO

## 2019-01-21 ENCOUNTER — Encounter: Payer: Self-pay | Admitting: Family Medicine

## 2019-01-21 ENCOUNTER — Ambulatory Visit (INDEPENDENT_AMBULATORY_CARE_PROVIDER_SITE_OTHER): Payer: Medicare HMO | Admitting: Family Medicine

## 2019-01-21 DIAGNOSIS — R69 Illness, unspecified: Secondary | ICD-10-CM | POA: Diagnosis not present

## 2019-01-21 DIAGNOSIS — E559 Vitamin D deficiency, unspecified: Secondary | ICD-10-CM | POA: Diagnosis not present

## 2019-01-21 DIAGNOSIS — F419 Anxiety disorder, unspecified: Secondary | ICD-10-CM

## 2019-01-21 DIAGNOSIS — R739 Hyperglycemia, unspecified: Secondary | ICD-10-CM

## 2019-01-21 NOTE — Patient Instructions (Signed)
BEFORE YOU LEAVE: -follow up: AWV with Dr. Maudie Mercury in August or September (1 year form prior)  Continue current treatment.  Follow up sooner as needed

## 2019-01-29 DIAGNOSIS — R69 Illness, unspecified: Secondary | ICD-10-CM | POA: Diagnosis not present

## 2019-02-26 ENCOUNTER — Ambulatory Visit: Payer: Medicare HMO | Admitting: Family Medicine

## 2019-03-03 ENCOUNTER — Ambulatory Visit (INDEPENDENT_AMBULATORY_CARE_PROVIDER_SITE_OTHER): Payer: Medicare HMO | Admitting: Family Medicine

## 2019-03-03 ENCOUNTER — Other Ambulatory Visit: Payer: Self-pay

## 2019-03-03 ENCOUNTER — Encounter: Payer: Self-pay | Admitting: Family Medicine

## 2019-03-03 ENCOUNTER — Telehealth: Payer: Self-pay | Admitting: *Deleted

## 2019-03-03 DIAGNOSIS — M545 Low back pain, unspecified: Secondary | ICD-10-CM

## 2019-03-03 MED ORDER — TIZANIDINE HCL 2 MG PO TABS: 2.0000 mg | ORAL_TABLET | Freq: Three times a day (TID) | ORAL | 0 refills | Status: DC | PRN

## 2019-03-03 NOTE — Progress Notes (Signed)
Virtual Visit via Video Note  I connected with Destiny Robbins on 03/03/19 at 11:30 AM EDT by a video enabled telemedicine application and verified that I am speaking with the correct person using two identifiers. She tried several times to get the video portion working, but was unable, so completed the visit successfully with audio.  Location patient: home Location provider:work or home office Persons participating in the virtual visit: patient, provider  I discussed the limitations of evaluation and management by telemedicine and the availability of in person appointments. The patient expressed understanding and agreed to proceed.   HPI:  Low back pain: -this started 3 days ago, can't think of inciting event - was picking up grandson a lot -history of same in the past, muscle relaxer really helped -L low back - worse with certain movements, ok at rest, better after getting up and getting going, sharp then and moderate -aleve helps some -denies: radiation, weakness, numbness, malaise, fevers, bowel or bladder incontinence  ROS: See pertinent positives and negatives per HPI.  Past Medical History:  Diagnosis Date  . Anxiety   . Chicken pox   . Chronic daily headache 10/28/2014  . Colon polyp   . Depression, recurrent ([place]) 10/28/2014  . Hyperglycemia 01/19/2019  . UTI (urinary tract infection)   . Vitamin D deficiency 01/19/2019    Past Surgical History:  Procedure Laterality Date  . BREAST ENHANCEMENT SURGERY    . BREAST IMPLANT REMOVAL    . EXCISION MORTON'S NEUROMA    . TOTAL ABDOMINAL HYSTERECTOMY  2001    History reviewed. No pertinent family history.  SOCIAL HX: see hpi   Current Outpatient Medications:  .  FLUoxetine (PROZAC) 40 MG capsule, Take 1 capsule (40 mg total) by mouth daily., Disp: 90 capsule, Rfl: 1 .  tiZANidine (ZANAFLEX) 2 MG tablet, Take 1 tablet (2 mg total) by mouth every 8 (eight) hours as needed for muscle spasms., Disp: 30 tablet, Rfl: 0  EXAM:  VITALS  per patient if applicabl  GENERAL: alert, oriented, appears well and in no acute distress  HEENT: atraumatic, conjunttiva cl ear, no obvious abnormalities on inspection of external nose and ears  NECK: normal movements of the head and neck  LUNGS: on inspection no signs of respiratory distress, breathing rate appears normal, no obvious gross SOB, gasping or wheezing  CV: no obvious cyanosis  MS: moves all visible extremities without noticeable abnormality  PSYCH/NEURO: pleasant and cooperative, no obvious depression or anxiety, speech and thought processing grossly intact  ASSESSMENT AND PLAN:  Discussed the following assessment and plan:  Left-sided low back pain without sciatica, unspecified chronicity  -we discussed possible serious and likely etiologies, workup and treatment, treatment risks and return precautions -after this discussion, Eddis opted for  HEP (will have my assistant mail these to her), heat, topical menthol, tylenol and/or aleve if needed per bottle dosing only, tizanidine after discussion risks for muscle spasm -follow up advised 2-3 weeks -of course, we advised Dava  to return or notify a doctor immediately if symptoms worsen or persist or new concerns arise.  I discussed the assessment and treatment plan with the patient. The patient was provided an opportunity to ask questions and all were answered. The patient agreed with the plan and demonstrated an understanding of the instructions.    Follow up instructions: Advised assistant Wendie Simmer to help patient arrange the following: -please mail her the low back exercises -follow up in 2-3 weeks with Dr. Maudie Mercury, video visit  Nickola Major  Maudie Mercury, DO

## 2019-03-03 NOTE — Telephone Encounter (Signed)
Per office notes from 4/6, I called the pt and informed her low back exercises were mailed to her home address.  She stated she will call back for a follow up visit.

## 2019-06-02 ENCOUNTER — Encounter: Payer: Self-pay | Admitting: Family Medicine

## 2019-06-02 ENCOUNTER — Ambulatory Visit (INDEPENDENT_AMBULATORY_CARE_PROVIDER_SITE_OTHER): Payer: Medicare HMO | Admitting: Family Medicine

## 2019-06-02 ENCOUNTER — Telehealth: Payer: Self-pay | Admitting: *Deleted

## 2019-06-02 ENCOUNTER — Other Ambulatory Visit: Payer: Self-pay

## 2019-06-02 DIAGNOSIS — Z13220 Encounter for screening for lipoid disorders: Secondary | ICD-10-CM

## 2019-06-02 DIAGNOSIS — R739 Hyperglycemia, unspecified: Secondary | ICD-10-CM | POA: Diagnosis not present

## 2019-06-02 DIAGNOSIS — E559 Vitamin D deficiency, unspecified: Secondary | ICD-10-CM | POA: Diagnosis not present

## 2019-06-02 DIAGNOSIS — R5383 Other fatigue: Secondary | ICD-10-CM

## 2019-06-02 DIAGNOSIS — F339 Major depressive disorder, recurrent, unspecified: Secondary | ICD-10-CM | POA: Diagnosis not present

## 2019-06-02 DIAGNOSIS — R69 Illness, unspecified: Secondary | ICD-10-CM | POA: Diagnosis not present

## 2019-06-02 MED ORDER — FLUOXETINE HCL 20 MG PO TABS: 20.0000 mg | ORAL_TABLET | Freq: Every day | ORAL | 1 refills | Status: DC

## 2019-06-02 MED ORDER — VITAMIN D 25 MCG (1000 UNIT) PO TABS: 5000.0000 [IU] | ORAL_TABLET | Freq: Every day | ORAL | Status: DC

## 2019-06-02 NOTE — Telephone Encounter (Signed)
-----   Message from Caren Macadam, MD sent at 06/02/2019  9:07 AM EDT ----- Please call and set up bloodwork for her at her convenience

## 2019-06-02 NOTE — Progress Notes (Signed)
Virtual Visit via Video Note  I connected with Destiny Robbins   on 06/02/19 at  8:30 AM EDT by a video enabled telemedicine application and verified that I am speaking with the correct person using two identifiers.  Location patient: home Location provider:work office Persons participating in the virtual visit: patient, provider  I discussed the limitations of evaluation and management by telemedicine and the availability of in person appointments. The patient expressed understanding and agreed to proceed.  Video feed not working today but able to complete audio visit.   Destiny Robbins Well DOB: [birth year] Encounter date: 06/02/2019  This is a 73 y.o. female who presents to establish care. No chief complaint on file.  Last visit 02/2019 for low back pain- this has resolved. Just a certain way she moves that will trigger pain.   Has declined follow up bloodwork, bone density  History of present illness: Headache:no longer getting headaches.  Depression/anxiety: prozac 40mg  daily. Feels very tired during day and would like to try to decrease her dose. Not always great about getting good variety with meals. Not good about eating all fruits/vegetables. Still getting hot flashes. Worse with hot weather. Has always had these since hysterectomy. Prozac helps. Mood has been good. Doesn't feel like anxiety or depression are issue.  Hyperglycemia:not watching diet. Drinks coke daily.  Vitamin D def: Does take this daily   AWV 06/2018 w [name] Mammogram 12/2018:negative Cologuard 02/2017:negative Declines bone density test; doesn't like taking medication and wouldn't take medication for this if needed.   Past Medical History:  Diagnosis Date  . Anxiety   . Chicken pox   . Chronic daily headache 10/28/2014  . Colon polyp   . Depression, recurrent ([place]) 10/28/2014  . Hyperglycemia 01/19/2019  . UTI (urinary tract infection)   . Vitamin D deficiency 01/19/2019   Past Surgical History:   Procedure Laterality Date  . BREAST ENHANCEMENT SURGERY    . BREAST IMPLANT REMOVAL    . BUNIONECTOMY Right   . EXCISION MORTON'S NEUROMA Right   . TOTAL ABDOMINAL HYSTERECTOMY  2001   abnormal bleeding/spotting. hx of abnormal pap. released from further pap testing   Allergies  Allergen Reactions  . Vicodin [Hydrocodone-Acetaminophen] Rash   Current Meds  Medication Sig  . [DISCONTINUED] FLUoxetine (PROZAC) 40 MG capsule Take 1 capsule (40 mg total) by mouth daily.  . [DISCONTINUED] tiZANidine (ZANAFLEX) 2 MG tablet Take 1 tablet (2 mg total) by mouth every 8 (eight) hours as needed for muscle spasms.   Social History   Tobacco Use  . Smoking status: Never Smoker  . Smokeless tobacco: Never Used  Substance Use Topics  . Alcohol use: Yes    Alcohol/week: 0.0 standard drinks    Comment: occ; about 4 times per year    Family History  Problem Relation Age of Onset  . Other Mother        bowel obstruction  . Healthy Father      Review of Systems  Constitutional: Negative for chills, fatigue and fever.  Respiratory: Negative for cough, chest tightness, shortness of breath and wheezing.   Cardiovascular: Negative for chest pain, palpitations and leg swelling.  Neurological: Negative for dizziness and headaches.  Psychiatric/Behavioral: Positive for sleep disturbance (has never slept well). Negative for suicidal ideas. The patient is not nervous/anxious.     Objective:  There were no vitals taken for this visit.      BP Readings from Last 3 Encounters:  01/21/19 110/62  10/22/18 120/60  07/22/18 138/78   Wt Readings from Last 3 Encounters:  01/21/19 129 lb 4.8 oz (58.7 kg)  07/22/18 133 lb 8 oz (60.6 kg)  07/22/18 133 lb 8 oz (60.6 kg)    EXAM:  GENERAL: alert, oriented, appears well and in no acute distress LUNGS: no shortness of breath over phone. PSYCH/NEURO: pleasant and cooperative, no obvious depression or anxiety, speech and thought processing grossly  intact  Assessment/Plan 1. Depression, recurrent ([place]) Stable. Would like to decrease dose of medication to see if this helps with fatigue. Will try 20mg  and let me know if this is not working for anxiety/depression control. Follow up pending bloodwork. - CBC with Differential/Platelet; Future - FLUoxetine (PROZAC) 20 MG tablet; Take 1 tablet (20 mg total) by mouth daily.  Dispense: 90 tablet; Refill: 1  2. Vitamin D deficiency - VITAMIN D 25 Hydroxy (Vit-D Deficiency, Fractures); Future - cholecalciferol (VITAMIN D3) 25 MCG (1000 UT) tablet; Take 5 tablets (5,000 Units total) by mouth daily.  3. Hyperglycemia - Comprehensive metabolic panel; Future - Hemoglobin A1c; Future  4. Lipid screening - Lipid panel; Future  5. Fatigue, unspecified type - CBC with Differential/Platelet; Future - Comprehensive metabolic panel; Future - TSH; Future  Return visit pending bloodwork results. Likely ok for 6 month CCV..    I discussed the assessment and treatment plan with the patient. The patient was provided an opportunity to ask questions and all were answered. The patient agreed with the plan and demonstrated an understanding of the instructions.   The patient was advised to call back or seek an in-person evaluation if the symptoms worsen or if the condition fails to improve as anticipated.  I provided 25 minutes of non-face-to-face time during this encounter.   Destiny Rough, MD

## 2019-06-02 NOTE — Telephone Encounter (Signed)
I called the pt and scheduled an appt for 7/9. 

## 2019-06-05 ENCOUNTER — Other Ambulatory Visit (INDEPENDENT_AMBULATORY_CARE_PROVIDER_SITE_OTHER): Payer: Medicare HMO

## 2019-06-05 ENCOUNTER — Other Ambulatory Visit: Payer: Self-pay

## 2019-06-05 DIAGNOSIS — Z13220 Encounter for screening for lipoid disorders: Secondary | ICD-10-CM | POA: Diagnosis not present

## 2019-06-05 DIAGNOSIS — E559 Vitamin D deficiency, unspecified: Secondary | ICD-10-CM | POA: Diagnosis not present

## 2019-06-05 DIAGNOSIS — R5383 Other fatigue: Secondary | ICD-10-CM | POA: Diagnosis not present

## 2019-06-05 DIAGNOSIS — F339 Major depressive disorder, recurrent, unspecified: Secondary | ICD-10-CM

## 2019-06-05 DIAGNOSIS — R739 Hyperglycemia, unspecified: Secondary | ICD-10-CM

## 2019-06-05 DIAGNOSIS — R69 Illness, unspecified: Secondary | ICD-10-CM | POA: Diagnosis not present

## 2019-06-05 LAB — HEMOGLOBIN A1C: Hgb A1c MFr Bld: 6 % (ref 4.6–6.5)

## 2019-06-05 LAB — TSH: TSH: 2.18 u[IU]/mL (ref 0.35–4.50)

## 2019-06-05 LAB — CBC WITH DIFFERENTIAL/PLATELET
Basophils Absolute: 0.1 10*3/uL (ref 0.0–0.1)
Basophils Relative: 0.8 % (ref 0.0–3.0)
Eosinophils Absolute: 0.3 10*3/uL (ref 0.0–0.7)
Eosinophils Relative: 3.4 % (ref 0.0–5.0)
HCT: 39.5 % (ref 36.0–46.0)
Hemoglobin: 13.1 g/dL (ref 12.0–15.0)
Lymphocytes Relative: 36.5 % (ref 12.0–46.0)
Lymphs Abs: 2.8 10*3/uL (ref 0.7–4.0)
MCHC: 33.2 g/dL (ref 30.0–36.0)
MCV: 93.6 fl (ref 78.0–100.0)
Monocytes Absolute: 0.5 10*3/uL (ref 0.1–1.0)
Monocytes Relative: 6.1 % (ref 3.0–12.0)
Neutro Abs: 4.1 10*3/uL (ref 1.4–7.7)
Neutrophils Relative %: 53.2 % (ref 43.0–77.0)
Platelets: 283 10*3/uL (ref 150.0–400.0)
RBC: 4.22 Mil/uL (ref 3.87–5.11)
RDW: 13.7 % (ref 11.5–15.5)
WBC: 7.8 10*3/uL (ref 4.0–10.5)

## 2019-06-05 LAB — COMPREHENSIVE METABOLIC PANEL
ALT: 13 U/L (ref 0–35)
AST: 17 U/L (ref 0–37)
Albumin: 4.2 g/dL (ref 3.5–5.2)
Alkaline Phosphatase: 66 U/L (ref 39–117)
BUN: 16 mg/dL (ref 6–23)
CO2: 30 mEq/L (ref 19–32)
Calcium: 9.1 mg/dL (ref 8.4–10.5)
Chloride: 99 mEq/L (ref 96–112)
Creatinine, Ser: 0.85 mg/dL (ref 0.40–1.20)
GFR: 65.98 mL/min (ref >60.00)
Glucose, Bld: 87 mg/dL (ref 70–99)
Potassium: 4.6 mEq/L (ref 3.5–5.1)
Sodium: 137 mEq/L (ref 135–145)
Total Bilirubin: 0.5 mg/dL (ref 0.2–1.2)
Total Protein: 7.1 g/dL (ref 6.0–8.3)

## 2019-06-05 LAB — VITAMIN D 25 HYDROXY (VIT D DEFICIENCY, FRACTURES): VITD: 25.2 ng/mL — ABNORMAL LOW (ref 30.00–100.00)

## 2019-06-05 LAB — LIPID PANEL
Cholesterol: 195 mg/dL (ref 0–200)
HDL: 52.4 mg/dL (ref >39.00)
LDL Cholesterol: 109 mg/dL — ABNORMAL HIGH (ref 0–99)
NonHDL: 142.86
Total CHOL/HDL Ratio: 4
Triglycerides: 170 mg/dL — ABNORMAL HIGH (ref 0.0–149.0)
VLDL: 34 mg/dL (ref 0.0–40.0)

## 2019-07-24 ENCOUNTER — Ambulatory Visit: Payer: Medicare HMO | Admitting: Family Medicine

## 2019-07-30 DIAGNOSIS — L89613 Pressure ulcer of right heel, stage 3: Secondary | ICD-10-CM | POA: Diagnosis not present

## 2019-07-30 DIAGNOSIS — S3210XD Unspecified fracture of sacrum, subsequent encounter for fracture with routine healing: Secondary | ICD-10-CM | POA: Diagnosis not present

## 2019-07-30 DIAGNOSIS — R69 Illness, unspecified: Secondary | ICD-10-CM | POA: Diagnosis not present

## 2019-11-17 ENCOUNTER — Ambulatory Visit (INDEPENDENT_AMBULATORY_CARE_PROVIDER_SITE_OTHER): Payer: Medicare HMO

## 2019-11-17 VITALS — Ht 65.0 in | Wt 135.0 lb

## 2019-11-17 DIAGNOSIS — Z0000 Encounter for general adult medical examination without abnormal findings: Secondary | ICD-10-CM

## 2019-11-17 NOTE — Patient Instructions (Signed)
Destiny Robbins , Thank you for taking time to participate in your Medicare Wellness Visit. I appreciate your ongoing commitment to your health goals. Please review the following plan we discussed and let me know if I can assist you in the future.   Screening recommendations/referrals: Colorectal Screening: completed a cologuard 03/12/2017 and declines further testing. Mammogram: 01/15/2019; declines further testing  Bone Density: declines  Vision and Dental Exams: Recommended annual ophthalmology exams for early detection of glaucoma and other disorders of the eye Recommended annual dental exams for proper oral hygiene  Diabetic Exams: Diabetic Eye Exam: N/A Diabetic Foot Exam: N/A  Vaccinations: Influenza vaccine: declines Pneumococcal vaccine: completed 12/09/2014; up to date.  Tdap vaccine: completed 05/27/2012; valid until 05/27/2022. Shingles vaccine: As discussed, please check with your local pharmacy to determine your out of pocket costs. This is a series of two injections. Advanced directives: Advance directives discussed with you today. Please bring a copy of your POA (Power of [place]) and/or Living Will to your next appointment.  Goals: Recommend to drink at least 6-8 8oz glasses of water per day. Recommend to exercise at least 3 times per week. This will also help with mental well being.  Next appointment: Please schedule your Annual Wellness Visit with your Nurse Health Advisor in one year.  Preventive Care 16 Years and Older, Female Preventive care refers to lifestyle choices and visits with your health care provider that can promote health and wellness. What does preventive care include?  A yearly physical exam. This is also called an annual well check.  Dental exams once or twice a year.  Routine eye exams. Ask your health care provider how often you should have your eyes checked.  Personal lifestyle choices, including:  Daily care of your teeth and gums.  Regular  physical activity.  Eating a healthy diet.  Avoiding tobacco and drug use.  Limiting alcohol use.  Practicing safe sex.  Taking low-dose aspirin every day if recommended by your health care provider.  Taking vitamin and mineral supplements as recommended by your health care provider. What happens during an annual well check? The services and screenings done by your health care provider during your annual well check will depend on your age, overall health, lifestyle risk factors, and family history of disease. Counseling  Your health care provider may ask you questions about your:  Alcohol use.  Tobacco use.  Drug use.  Emotional well-being.  Home and relationship well-being.  Sexual activity.  Eating habits.  History of falls.  Memory and ability to understand (cognition).  Work and work Statistician.  Reproductive health. Screening  You may have the following tests or measurements:  Height, weight, and BMI.  Blood pressure.  Lipid and cholesterol levels. These may be checked every 5 years, or more frequently if you are over 35 years old.  Skin check.  Lung cancer screening. You may have this screening every year starting at age 85 if you have a 30-pack-year history of smoking and currently smoke or have quit within the past 15 years.  Fecal occult blood test (FOBT) of the stool. You may have this test every year starting at age 80.  Flexible sigmoidoscopy or colonoscopy. You may have a sigmoidoscopy every 5 years or a colonoscopy every 10 years starting at age 65.  Hepatitis C blood test.  Hepatitis B blood test.  Sexually transmitted disease (STD) testing.  Diabetes screening. This is done by checking your blood sugar (glucose) after you have not eaten for  a while (fasting). You may have this done every 1-3 years.  Bone density scan. This is done to screen for osteoporosis. You may have this done starting at age 26.  Mammogram. This may be done every  1-2 years. Talk to your health care provider about how often you should have regular mammograms. Talk with your health care provider about your test results, treatment options, and if necessary, the need for more tests. Vaccines  Your health care provider may recommend certain vaccines, such as:  Influenza vaccine. This is recommended every year.  Tetanus, diphtheria, and acellular pertussis (Tdap, Td) vaccine. You may need a Td booster every 10 years.  Zoster vaccine. You may need this after age 58.  Pneumococcal 13-valent conjugate (PCV13) vaccine. One dose is recommended after age 35.  Pneumococcal polysaccharide (PPSV23) vaccine. One dose is recommended after age 55. Talk to your health care provider about which screenings and vaccines you need and how often you need them. This information is not intended to replace advice given to you by your health care provider. Make sure you discuss any questions you have with your health care provider. Document Released: 12/10/2015 Document Revised: 08/02/2016 Document Reviewed: 09/14/2015 Elsevier Interactive Patient Education  2017 [place] Prevention in the Home Falls can cause injuries. They can happen to people of all ages. There are many things you can do to make your home safe and to help prevent falls. What can I do on the outside of my home?  Regularly fix the edges of walkways and driveways and fix any cracks.  Remove anything that might make you trip as you walk through a door, such as a raised step or threshold.  Trim any bushes or trees on the path to your home.  Use bright outdoor lighting.  Clear any walking paths of anything that might make someone trip, such as rocks or tools.  Regularly check to see if handrails are loose or broken. Make sure that both sides of any steps have handrails.  Any raised decks and porches should have guardrails on the edges.  Have any leaves, snow, or ice cleared regularly.  Use  sand or salt on walking paths during winter.  Clean up any spills in your garage right away. This includes oil or grease spills. What can I do in the bathroom?  Use night lights.  Install grab bars by the toilet and in the tub and shower. Do not use towel bars as grab bars.  Use non-skid mats or decals in the tub or shower.  If you need to sit down in the shower, use a plastic, non-slip stool.  Keep the floor dry. Clean up any water that spills on the floor as soon as it happens.  Remove soap buildup in the tub or shower regularly.  Attach bath mats securely with double-sided non-slip rug tape.  Do not have throw rugs and other things on the floor that can make you trip. What can I do in the bedroom?  Use night lights.  Make sure that you have a light by your bed that is easy to reach.  Do not use any sheets or blankets that are too big for your bed. They should not hang down onto the floor.  Have a firm chair that has side arms. You can use this for support while you get dressed.  Do not have throw rugs and other things on the floor that can make you trip. What can I do in  the kitchen?  Clean up any spills right away.  Avoid walking on wet floors.  Keep items that you use a lot in easy-to-reach places.  If you need to reach something above you, use a strong step stool that has a grab bar.  Keep electrical cords out of the way.  Do not use floor polish or wax that makes floors slippery. If you must use wax, use non-skid floor wax.  Do not have throw rugs and other things on the floor that can make you trip. What can I do with my stairs?  Do not leave any items on the stairs.  Make sure that there are handrails on both sides of the stairs and use them. Fix handrails that are broken or loose. Make sure that handrails are as long as the stairways.  Check any carpeting to make sure that it is firmly attached to the stairs. Fix any carpet that is loose or worn.  Avoid  having throw rugs at the top or bottom of the stairs. If you do have throw rugs, attach them to the floor with carpet tape.  Make sure that you have a light switch at the top of the stairs and the bottom of the stairs. If you do not have them, ask someone to add them for you. What else can I do to help prevent falls?  Wear shoes that:  Do not have high heels.  Have rubber bottoms.  Are comfortable and fit you well.  Are closed at the toe. Do not wear sandals.  If you use a stepladder:  Make sure that it is fully opened. Do not climb a closed stepladder.  Make sure that both sides of the stepladder are locked into place.  Ask someone to hold it for you, if possible.  Clearly mark and make sure that you can see:  Any grab bars or handrails.  First and last steps.  Where the edge of each step is.  Use tools that help you move around (mobility aids) if they are needed. These include:  Canes.  Walkers.  Scooters.  Crutches.  Turn on the lights when you go into a dark area. Replace any light bulbs as soon as they burn out.  Set up your furniture so you have a clear path. Avoid moving your furniture around.  If any of your floors are uneven, fix them.  If there are any pets around you, be aware of where they are.  Review your medicines with your doctor. Some medicines can make you feel dizzy. This can increase your chance of falling. Ask your doctor what other things that you can do to help prevent falls. This information is not intended to replace advice given to you by your health care provider. Make sure you discuss any questions you have with your health care provider. Document Released: 09/09/2009 Document Revised: 04/20/2016 Document Reviewed: 12/18/2014 Elsevier Interactive Patient Education  2017 [employer].

## 2019-11-17 NOTE — Progress Notes (Signed)
This visit is being conducted via phone call due to the COVID-19 pandemic. This patient has given me verbal consent via phone to conduct this visit, patient states they are participating from their home address. Some vital signs may be absent or patient reported.   Patient identification: identified by name, DOB, and current address.  Location provider: [facility], Office Persons participating in the virtual visit: [name] and Franne Forts, LPN.    Subjective:   Destiny Robbins is a 73 y.o. female who presents for Medicare Annual (Subsequent) preventive examination.  Destiny Robbins is active by working in her yard and helping with grandchildren. She eats three meals daily but reports she eats extra and understands she could cut back on that. She declines DEXA scan, mammogram, colon cancer screening, and flu vaccine. She is considering shingrix vaccine however. She states she weaned herself off prozac because she didn't like how it pulled her down. She wanted to cancel her follow up with Dr. Ethlyn Gallery because she is no longer taking the prozac and is feeling fine.   Review of Systems:   Cardiac Risk Factors include: advanced age (>32men, >63 women);sedentary lifestyle    Objective:     Vitals: Ht 5\' 5"  (1.651 m)   Wt 135 lb (61.2 kg) Comment: patient reported  BMI 22.47 kg/m   Body mass index is 22.47 kg/m.  Advanced Directives 11/17/2019 07/22/2018 12/13/2015 11/10/2015 11/04/2015 11/01/2015 12/09/2014  Does Patient Have a Medical Advance Directive? Yes Yes Yes Yes Yes Yes Yes  Type of Paramedic of [place];Living will - [place];Living will [place];Living will [place];Living will [place];Living will [place];Living will  Does patient want to make changes to medical advance directive? No - Patient declined - No - Patient declined - - No - Patient  declined No - Patient declined  Copy of [place] in Chart? No - copy requested - Yes Yes Yes Yes No - copy requested    Tobacco Social History   Tobacco Use  Smoking Status Never Smoker  Smokeless Tobacco Never Used     Counseling given: Not Answered   Clinical Intake:  Pre-visit preparation completed: Yes  Pain : No/denies pain     Nutritional Status: BMI of 19-24  Normal Nutritional Risks: None Diabetes: No  How often do you need to have someone help you when you read instructions, pamphlets, or other written materials from your doctor or pharmacy?: 1 - Never What is the last grade level you completed in school?: high school graduate  Interpreter Needed?: No  Information entered by :: Franne Forts, LPN.  Past Medical History:  Diagnosis Date  . Anxiety   . Chicken pox   . Chronic daily headache 10/28/2014  . Colon polyp   . Depression, recurrent ([place]) 10/28/2014  . Hyperglycemia 01/19/2019  . UTI (urinary tract infection)   . Vitamin D deficiency 01/19/2019   Past Surgical History:  Procedure Laterality Date  . BREAST ENHANCEMENT SURGERY    . BREAST IMPLANT REMOVAL    . BUNIONECTOMY Right   . EXCISION MORTON'S NEUROMA Right   . TOTAL ABDOMINAL HYSTERECTOMY  2001   abnormal bleeding/spotting. hx of abnormal pap. released from further pap testing   Family History  Problem Relation Age of Onset  . Other Mother        bowel obstruction  . Healthy Father    Social History  Socioeconomic History  . Marital status: Divorced    Spouse name: Not on file  . Number of children: 2  . Years of education: 3   . Highest education level: High school graduate  Occupational History  . Occupation: retired   Tobacco Use  . Smoking status: Never Smoker  . Smokeless tobacco: Never Used  Substance and Sexual Activity  . Alcohol use: Yes    Alcohol/week: 0.0 standard drinks    Comment: occ; about 4 times per year   . Drug use: No  . Sexual  activity: Not on file  Other Topics Concern  . 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 Difficulty of Paying Living Expenses: Not hard at all  Food Insecurity: No Food Insecurity  . Worried About Charity fundraiser in the Last Year: Never true  . Ran Out of Food in the Last Year: Never true  Transportation Needs:   . Lack of Transportation (Medical): Not on file  . Lack of Transportation (Non-Medical): Not on file  Physical Activity:   . Days of Exercise per Week: Not on file  . Minutes of Exercise per Session: Not on file  Stress:   . Feeling of Stress : Not on file  Social Connections:   . Frequency of Communication with Friends and Family: Not on file  . Frequency of Social Gatherings with Friends and Family: Not on file  . Attends Religious Services: Not on file  . Active Member of Clubs or Organizations: Not on file  . Attends Archivist Meetings: Not on file  . Marital Status: Not on file    Outpatient Encounter Medications as of 11/17/2019  Medication Sig  . cholecalciferol (VITAMIN D3) 25 MCG (1000 UT) tablet Take 5 tablets (5,000 Units total) by mouth daily.  Marland Kitchen FLUoxetine (PROZAC) 20 MG tablet Take 1 tablet (20 mg total) by mouth daily. (Patient not taking: Reported on 11/17/2019)   No facility-administered encounter medications on file as of 11/17/2019.    Activities of Daily Living In your present state of health, do you have any difficulty performing the following activities: 11/17/2019  Hearing? N  Vision? N  Difficulty concentrating or making decisions? N  Walking or climbing stairs? N  Dressing or bathing? N  Doing errands,  shopping? N  Preparing Food and eating ? N  Using the Toilet? N  In the past six months, have you accidently leaked urine? N  Do you have problems with loss of bowel control? N  Managing your Medications? N  Managing your Finances? N  Housekeeping or managing your Housekeeping? N  Some recent data might be hidden    Patient Care Team: Caren Macadam, MD as PCP - General (Family Medicine)    Assessment:   This is a routine wellness examination for Destiny Robbins.  Exercise Activities and Dietary recommendations Current Exercise Habits: The patient does not participate in regular exercise at present, Exercise limited by: None identified  Goals    . Exercise 3x per week (30 min per time)  Give it a try! Exercise is a good, natural way to help with depression.        Fall Risk Fall Risk  11/17/2019 07/22/2018 03/06/2017 12/13/2015 12/13/2015  Falls in the past year? 0 No No No No  Number falls in past yr: - - - - -  Risk for fall due to : - - - - -  Follow up Falls evaluation completed;Education provided;Falls prevention discussed - - - -   Is the patient's home free of loose throw rugs in walkways, pet beds, electrical cords, etc?   yes      Grab bars in the bathroom? yes      Handrails on the stairs?   yes      Adequate lighting?   yes  Timed Get Up and Go performed: N/A due to telephone visit  Depression Screen PHQ 2/9 Scores 11/17/2019 06/02/2019 01/21/2019 10/22/2018  PHQ - 2 Score 0 0 0 0  PHQ- 9 Score - 5 2 1      Cognitive Function MMSE - Mini Mental State Exam 07/22/2018  Not completed: (No Data)     6CIT Screen 11/17/2019  What Year? 0 points  What month? 0 points  What time? 0 points  Count back from 20 0 points  Months in reverse 0 points  Repeat phrase 0 points  Total Score 0    Immunization History  Administered Date(s) Administered  . Influenza, High Dose Seasonal PF 10/28/2015, 08/08/2016  . Influenza,inj,Quad PF,6+ Mos 12/17/2013  .  Influenza-Unspecified 08/30/2014  . Pneumococcal Conjugate-13 12/17/2013  . Pneumococcal Polysaccharide-23 12/09/2014  . Tdap 05/27/2012  . Zoster 04/27/2009    Qualifies for Shingles Vaccine?Yes, she will check with pharmacy on her out of pocket costs and possibly obtain there.   Screening Tests Health Maintenance  Topic Date Due  . Hepatitis C Screening  1946-04-24  . DEXA SCAN  09/16/2013  . INFLUENZA VACCINE  06/28/2019  . Fecal DNA (Cologuard)  03/12/2020  . MAMMOGRAM  01/15/2021  . TETANUS/TDAP  05/27/2022  . PNA vac Low Risk Adult  Completed    Cancer Screenings: Lung: Low Dose CT Chest recommended if Age 29-80 years, 30 pack-year currently smoking OR have quit w/in 15years. Patient does not qualify. Breast:  Up to date on Mammogram? No ; declines Up to date of Bone Density/Dexa? No; declines Colorectal: no; declines.  Additional Screenings:  Hepatitis C Screening: declines     Plan:  Ms. Heath declines most preventive health screenings including colon cancer screening, DEXA scan, mammogram, and flu vaccine. She agrees to check with her pharmacy about shingrix cost.   I have personally reviewed and noted the following in the patient's chart:   . Medical and social history . Use of alcohol, tobacco or illicit drugs  . Current medications and supplements . Functional ability and status . Nutritional status . Physical activity . Advanced directives . List of other physicians . Hospitalizations, surgeries, and ER visits in previous 12 months . Vitals . Screenings to include cognitive, depression, and falls . Referrals and appointments  In addition, I have reviewed and discussed with patient certain preventive protocols, quality metrics, and best practice recommendations. A written personalized care plan for preventive services as well as general preventive health recommendations were provided to patient.     Franne Forts, LPN  [id number]

## 2019-12-03 ENCOUNTER — Ambulatory Visit: Payer: Medicare HMO | Admitting: Family Medicine

## 2019-12-12 ENCOUNTER — Ambulatory Visit: Payer: Medicare HMO | Admitting: Family Medicine

## 2019-12-15 ENCOUNTER — Ambulatory Visit: Payer: Medicare HMO | Admitting: Family Medicine

## 2020-01-23 ENCOUNTER — Ambulatory Visit (INDEPENDENT_AMBULATORY_CARE_PROVIDER_SITE_OTHER): Payer: Medicare HMO

## 2020-01-23 ENCOUNTER — Encounter: Payer: Self-pay | Admitting: Family Medicine

## 2020-01-23 ENCOUNTER — Other Ambulatory Visit: Payer: Self-pay

## 2020-01-23 ENCOUNTER — Ambulatory Visit (INDEPENDENT_AMBULATORY_CARE_PROVIDER_SITE_OTHER): Payer: Medicare HMO | Admitting: Family Medicine

## 2020-01-23 VITALS — BP 140/80 | HR 91 | Temp 97.3°F | Wt 143.0 lb

## 2020-01-23 DIAGNOSIS — M25552 Pain in left hip: Secondary | ICD-10-CM | POA: Diagnosis not present

## 2020-01-23 DIAGNOSIS — M1612 Unilateral primary osteoarthritis, left hip: Secondary | ICD-10-CM | POA: Diagnosis not present

## 2020-01-23 MED ORDER — DICLOFENAC SODIUM 75 MG PO TBEC: 75.0000 mg | DELAYED_RELEASE_TABLET | Freq: Two times a day (BID) | ORAL | 0 refills | Status: DC

## 2020-01-23 NOTE — Progress Notes (Signed)
   Subjective:    Patient ID: Destiny Robbins, female    DOB: 03-30-1946, 74 y.o.   MRN: [id number]  HPI Here for left hip pain that started several years ago. This has gradually been getting worse. No specific trauma history. No locking or giving way. She takes Advil as needed. She has occasional lowe back pain, but she says that feels very different from the hip pain. The pain radiates down the anterior thigh. No numbness or tingling in the leg.    Review of Systems  Constitutional: Negative.   Respiratory: Negative.   Cardiovascular: Negative.   Musculoskeletal: Positive for arthralgias.       Objective:   Physical Exam Constitutional:      General: She is not in acute distress.    Appearance: Normal appearance.  Cardiovascular:     Rate and Rhythm: Normal rate and regular rhythm.     Pulses: Normal pulses.     Heart sounds: Normal heart sounds.  Pulmonary:     Effort: Pulmonary effort is normal.     Breath sounds: Normal breath sounds.  Musculoskeletal:     Comments: The left hip is normal on exam, full ROM with no pain. The greater trochanter is not tender   Neurological:     Mental Status: She is alert.           Assessment & Plan:  Left hip pain, likely due to osteoarthritis. Get Xrays of the hip today. Try Diclofenac as needed.  Alysia Penna, MD

## 2020-01-27 ENCOUNTER — Telehealth: Payer: Self-pay | Admitting: Family Medicine

## 2020-01-27 NOTE — Telephone Encounter (Signed)
The patient is wanting to know if Dr. Sarajane Jews can call her in something else form inflammation besides diclofenac (VOLTAREN) 75 MG EC tablet  The patient said that this Rx upsets her stomach even taking it with food. If Dr. Sarajane Jews can call her in something else [facility]   [facility] [place] [place] Phone:  [phone]  Fax:  [phone]     Please advise

## 2020-01-27 NOTE — Telephone Encounter (Signed)
See result notes for further documentation.  

## 2020-01-28 DIAGNOSIS — R69 Illness, unspecified: Secondary | ICD-10-CM | POA: Diagnosis not present

## 2020-02-04 DIAGNOSIS — H2513 Age-related nuclear cataract, bilateral: Secondary | ICD-10-CM | POA: Diagnosis not present

## 2020-02-04 DIAGNOSIS — H5211 Myopia, right eye: Secondary | ICD-10-CM | POA: Diagnosis not present

## 2020-02-04 DIAGNOSIS — H524 Presbyopia: Secondary | ICD-10-CM | POA: Diagnosis not present

## 2020-02-04 DIAGNOSIS — H52223 Regular astigmatism, bilateral: Secondary | ICD-10-CM | POA: Diagnosis not present

## 2020-02-05 DIAGNOSIS — Z0100 Encounter for examination of eyes and vision without abnormal findings: Secondary | ICD-10-CM | POA: Diagnosis not present

## 2020-02-19 ENCOUNTER — Other Ambulatory Visit: Payer: Self-pay | Admitting: Family Medicine

## 2020-02-19 NOTE — Telephone Encounter (Signed)
Message routed to PCP CMA  

## 2020-03-26 ENCOUNTER — Other Ambulatory Visit: Payer: Self-pay | Admitting: Family Medicine

## 2020-04-13 DIAGNOSIS — L570 Actinic keratosis: Secondary | ICD-10-CM | POA: Diagnosis not present

## 2020-04-13 DIAGNOSIS — B351 Tinea unguium: Secondary | ICD-10-CM | POA: Diagnosis not present

## 2020-04-13 DIAGNOSIS — L821 Other seborrheic keratosis: Secondary | ICD-10-CM | POA: Diagnosis not present

## 2020-05-26 ENCOUNTER — Ambulatory Visit (INDEPENDENT_AMBULATORY_CARE_PROVIDER_SITE_OTHER): Payer: Medicare HMO | Admitting: Family Medicine

## 2020-05-26 ENCOUNTER — Other Ambulatory Visit: Payer: Self-pay

## 2020-05-26 ENCOUNTER — Encounter: Payer: Self-pay | Admitting: Family Medicine

## 2020-05-26 VITALS — BP 120/70 | HR 95 | Temp 98.2°F | Ht 65.0 in | Wt 148.5 lb

## 2020-05-26 DIAGNOSIS — E559 Vitamin D deficiency, unspecified: Secondary | ICD-10-CM | POA: Diagnosis not present

## 2020-05-26 DIAGNOSIS — R5383 Other fatigue: Secondary | ICD-10-CM

## 2020-05-26 DIAGNOSIS — M7072 Other bursitis of hip, left hip: Secondary | ICD-10-CM | POA: Diagnosis not present

## 2020-05-26 DIAGNOSIS — Z13220 Encounter for screening for lipoid disorders: Secondary | ICD-10-CM | POA: Diagnosis not present

## 2020-05-26 DIAGNOSIS — E538 Deficiency of other specified B group vitamins: Secondary | ICD-10-CM | POA: Diagnosis not present

## 2020-05-26 LAB — COMPREHENSIVE METABOLIC PANEL
ALT: 22 U/L (ref 0–35)
AST: 19 U/L (ref 0–37)
Albumin: 4.2 g/dL (ref 3.5–5.2)
Alkaline Phosphatase: 52 U/L (ref 39–117)
BUN: 28 mg/dL — ABNORMAL HIGH (ref 6–23)
CO2: 28 mEq/L (ref 19–32)
Calcium: 9.6 mg/dL (ref 8.4–10.5)
Chloride: 97 mEq/L (ref 96–112)
Creatinine, Ser: 1.27 mg/dL — ABNORMAL HIGH (ref 0.40–1.20)
GFR: 41.4 mL/min — ABNORMAL LOW (ref >60.00)
Glucose, Bld: 97 mg/dL (ref 70–99)
Potassium: 3.9 mEq/L (ref 3.5–5.1)
Sodium: 134 mEq/L — ABNORMAL LOW (ref 135–145)
Total Bilirubin: 0.4 mg/dL (ref 0.2–1.2)
Total Protein: 7.1 g/dL (ref 6.0–8.3)

## 2020-05-26 LAB — CBC WITH DIFFERENTIAL/PLATELET
Basophils Absolute: 0.1 10*3/uL (ref 0.0–0.1)
Basophils Relative: 0.6 % (ref 0.0–3.0)
Eosinophils Absolute: 0.1 10*3/uL (ref 0.0–0.7)
Eosinophils Relative: 0.8 % (ref 0.0–5.0)
HCT: 37.2 % (ref 36.0–46.0)
Hemoglobin: 12.6 g/dL (ref 12.0–15.0)
Lymphocytes Relative: 27.4 % (ref 12.0–46.0)
Lymphs Abs: 2.8 10*3/uL (ref 0.7–4.0)
MCHC: 33.9 g/dL (ref 30.0–36.0)
MCV: 96.1 fl (ref 78.0–100.0)
Monocytes Absolute: 0.7 10*3/uL (ref 0.1–1.0)
Monocytes Relative: 6.5 % (ref 3.0–12.0)
Neutro Abs: 6.6 10*3/uL (ref 1.4–7.7)
Neutrophils Relative %: 64.7 % (ref 43.0–77.0)
Platelets: 308 10*3/uL (ref 150.0–400.0)
RBC: 3.87 Mil/uL (ref 3.87–5.11)
RDW: 13.1 % (ref 11.5–15.5)
WBC: 10.2 10*3/uL (ref 4.0–10.5)

## 2020-05-26 LAB — C-REACTIVE PROTEIN: CRP: <1 mg/dL (ref 0.5–20.0)

## 2020-05-26 LAB — VITAMIN B12: Vitamin B-12: >1526 pg/mL — ABNORMAL HIGH (ref 211–911)

## 2020-05-26 LAB — SEDIMENTATION RATE: Sed Rate: 34 mm/hr — ABNORMAL HIGH (ref 0–30)

## 2020-05-26 LAB — LDL CHOLESTEROL, DIRECT: Direct LDL: 137 mg/dL

## 2020-05-26 LAB — LIPID PANEL
Cholesterol: 223 mg/dL — ABNORMAL HIGH (ref 0–200)
HDL: 49.6 mg/dL (ref >39.00)
NonHDL: 173.53
Total CHOL/HDL Ratio: 4
Triglycerides: 229 mg/dL — ABNORMAL HIGH (ref 0.0–149.0)
VLDL: 45.8 mg/dL — ABNORMAL HIGH (ref 0.0–40.0)

## 2020-05-26 LAB — VITAMIN D 25 HYDROXY (VIT D DEFICIENCY, FRACTURES): VITD: 49.75 ng/mL (ref 30.00–100.00)

## 2020-05-26 LAB — TSH: TSH: 1.72 u[IU]/mL (ref 0.35–4.50)

## 2020-05-26 MED ORDER — TRIAMCINOLONE ACETONIDE 40 MG/ML IJ SUSP
40.0000 mg | Freq: Once | INTRAMUSCULAR | Status: AC
  Administered 2020-05-26: 40 mg via INTRA_ARTICULAR

## 2020-05-26 NOTE — Progress Notes (Signed)
Destiny Robbins DOB: 11/05/1946 Encounter date: 05/26/2020  This is a 74 y.o. female who presents with Chief Complaint  Patient presents with  . Hip Pain    History of present illness: Saw Dr. Sarajane Jews for hip pain on 2/26.  X-ray obtained at that time showed mild left hip degenerative changes. Visit was prompted after she had to quickly stop self with left foot in store.   Even went 30 years ago to ortho for hip pain. Right now not hurting her because laying in bed for several days. Doesn't hurt while laying down. Bothers her if she goes out and walks around block. Not debilitating. Doesn't sleep well at night. Right hip bothers her some too. Sleeps on side - causes achiness.   Back does bother her as well. Other day bent over to dry leg and got catch through low back. Avoiding activities at home because she knows it will cause pain. Pain across lower back - pain in anterior hip when they are bothering her.   Walking bothers her. Once painful activity doesn't matter - it all bother her.   Saw therapist last week. She has always had depression. Pills haven't worked for her in the past - seem to make her more depressed. Hoping to get pain addressed so that she can exercise and get natural seratonin levels raised. (longest she has stayed on med was prozac x 3 months).  Allergies  Allergen Reactions  . Vicodin [Hydrocodone-Acetaminophen] Rash   Current Meds  Medication Sig  . cholecalciferol (VITAMIN D3) 25 MCG (1000 UT) tablet Take 5 tablets (5,000 Units total) by mouth daily.  . Cyanocobalamin (B-12 PO) Take by mouth.  . Glucosamine-Chondroit-Vit C-Mn (GLUCOSAMINE 1500 COMPLEX PO) Take by mouth.  . terbinafine (LAMISIL) 250 MG tablet   . [DISCONTINUED] diclofenac (VOLTAREN) 75 MG EC tablet Take 1 tablet (75 mg total) by mouth 2 (two) times daily as needed.    Review of Systems  Constitutional: Negative for chills, fatigue and fever.  Respiratory: Negative for cough, chest  tightness, shortness of breath and wheezing.   Cardiovascular: Negative for chest pain, palpitations and leg swelling.  Gastrointestinal:       No bowel dysfunction  Genitourinary:       No bladder dysfunction  Musculoskeletal: Positive for arthralgias, back pain and gait problem (when hurting).  Neurological: Negative for weakness and numbness.    Objective:  BP 120/70 (BP Location: Left Arm, Patient Position: Sitting, Cuff Size: Normal)   Pulse 95   Temp 98.2 F (36.8 C) (Temporal)   Ht 5\' 5"  (1.651 m)   Wt 148 lb 8 oz (67.4 kg)   BMI 24.71 kg/m   Weight: 148 lb 8 oz (67.4 kg)   BP Readings from Last 3 Encounters:  05/26/20 120/70  01/23/20 140/80  01/21/19 110/62   Wt Readings from Last 3 Encounters:  05/26/20 148 lb 8 oz (67.4 kg)  01/23/20 143 lb (64.9 kg)  11/17/19 135 lb (61.2 kg)    Physical Exam Constitutional:      General: She is not in acute distress.    Appearance: She is well-developed.  Cardiovascular:     Rate and Rhythm: Normal rate and regular rhythm.     Heart sounds: Normal heart sounds. No murmur heard.  No friction rub.  Pulmonary:     Effort: Pulmonary effort is normal. No respiratory distress.     Breath sounds: Normal breath sounds. No wheezing or rales.  Musculoskeletal:     Right  lower leg: No edema.     Left lower leg: No edema.     Comments: No pain with hip flexion, internal or external rotation bilaterally.  There is tenderness localized to the left trochanteric bursa.  There is some tenderness of IT band on the left.  Neurological:     Mental Status: She is alert and oriented to person, place, and time.  Psychiatric:        Behavior: Behavior normal.   trochanteric bursa injection After verbal consent obtained, sit of bursa located with palpation. Area cleansed with chloroprep and 1cc kenalog with 4cc lidocaine injected. Patient tolerated procedure well.   Assessment/Plan  1. Other bursitis of hip, left hip Injection completed  today. Ice after activity in next 2 weeks. BID stretches.  We specifically reviewed stretches and I demonstrated them for her in the office.  We discussed great toe pose, cross leg stretch, figure 4 stretch.  If not getting relief with today's treatment, consider physical therapy or Ortho referral.  Encouraged patient to consider these options. - Sedimentation rate; Future  2. B12 deficiency - Vitamin B12; Future  3. Vitamin D deficiency - VITAMIN D 25 Hydroxy (Vit-D Deficiency, Fractures); Future  4. Fatigue, unspecified type - CBC with Differential/Platelet; Future - Comprehensive metabolic panel; Future - TSH; Future - ANA; Future - C-reactive protein; Future  5. Lipid screening - Lipid panel; Future   Return for give me update in 2 weeks about pain if not improved. Micheline Rough, MD

## 2020-05-26 NOTE — Patient Instructions (Addendum)
Stretches: Great toe stretch with belt lying on back on floor - belt under ball of foot  Figure four stretch when sitting  Cross leg stretch while standing Hip Bursitis  Hip bursitis is inflammation of a fluid-filled sac (bursa) in the hip joint. The bursa prevents the bones in the hip joint from rubbing against each other. Hip bursitis can cause mild to moderate pain, and symptoms often come and go over time. What are the causes? This condition may be caused by:  Injury to the hip.  Overuse of the muscles that surround the hip joint.  Previous injury or surgery of the hip.  Arthritis or gout.  Diabetes.  Thyroid disease.  Infection. In some cases, the cause may not be known. What are the signs or symptoms? Symptoms of this condition include:  Mild or moderate pain in the hip area. Pain may get worse with movement.  Tenderness and swelling of the hip, especially on the outer side of the hip.  In rare cases, the bursa may become infected. This may cause a fever, as well as warmth and redness in the area. Symptoms may come and go. How is this diagnosed? This condition may be diagnosed based on:  A physical exam.  Your medical history.  X-rays.  Removal of fluid from your inflamed bursa for testing (biopsy). You may be sent to a health care provider who specializes in bone diseases (orthopedist) or a provider who specializes in joint inflammation (rheumatologist). How is this treated? This condition is treated by resting, icing, applying pressure (compression), and raising (elevating) the injured area. This is called RICE treatment. In some cases, this may be enough to make your symptoms go away. Treatment may also include:  Using crutches.  Draining fluid out of the bursa to help relieve swelling.  Injecting medicine that helps to reduce inflammation (cortisone).  Additional medicines if the bursa is infected. Follow these instructions at home: Managing pain,  stiffness, and swelling   If directed, put ice on the painful area. ? Put ice in a plastic bag. ? Place a towel between your skin and the bag. ? Leave the ice on for 20 minutes, 2-3 times a day. ? Raise (elevate) your hip above the level of your heart as much as you can without pain. To do this, try putting a pillow under your hips while you lie down. Activity  Return to your normal activities as told by your health care provider. Ask your health care provider what activities are safe for you.  Rest and protect your hip as much as possible until your pain and swelling get better. General instructions  Take over-the-counter and prescription medicines only as told by your health care provider.  Wear compression wraps only as told by your health care provider.  Do not use your hip to support your body weight until your health care provider says that you can. Use crutches as told by your health care provider.  Gently massage and stretch your injured area as often as is comfortable.  Keep all follow-up visits as told by your health care provider. This is important. How is this prevented?  Exercise regularly, as told by your health care provider.  Warm up and stretch before being active.  Cool down and stretch after being active.  If an activity irritates your hip or causes pain, avoid the activity as much as possible.  Avoid sitting down for long periods at a time. Contact a health care provider if you:  Have a fever.  Develop new symptoms.  Have difficulty walking or doing everyday activities.  Have pain that gets worse or does not get better with medicine.  Develop red skin or a feeling of warmth in your hip area. Get help right away if you:  Cannot move your hip.  Have severe pain. Summary  Hip bursitis is inflammation of a fluid-filled sac (bursa) in the hip joint.  Hip bursitis can cause mild to moderate pain, and symptoms often come and go over time.  This  condition is treated with rest, ice, compression, elevation, and medicines. This information is not intended to replace advice given to you by your health care provider. Make sure you discuss any questions you have with your health care provider. Document Revised: 07/22/2018 Document Reviewed: 07/22/2018 Elsevier Patient Education  [place].

## 2020-05-27 DIAGNOSIS — Z79899 Other long term (current) drug therapy: Secondary | ICD-10-CM | POA: Diagnosis not present

## 2020-05-27 DIAGNOSIS — B351 Tinea unguium: Secondary | ICD-10-CM | POA: Diagnosis not present

## 2020-05-27 LAB — ANA: Anti Nuclear Antibody (ANA): NEGATIVE

## 2020-05-28 ENCOUNTER — Telehealth: Payer: Self-pay | Admitting: Family Medicine

## 2020-05-28 NOTE — Telephone Encounter (Signed)
Spoke with the pt and discussed the results of the cholesterol test.

## 2020-05-28 NOTE — Telephone Encounter (Signed)
Pt is calling in wanting to know her numbers for her cholesterol she stated the she was told it was a little higher from the last time but she likes to know the numbers.

## 2020-06-04 ENCOUNTER — Ambulatory Visit: Payer: Medicare HMO | Admitting: Family Medicine

## 2020-07-09 ENCOUNTER — Other Ambulatory Visit: Payer: Self-pay | Admitting: Family Medicine

## 2020-07-09 ENCOUNTER — Telehealth: Payer: Self-pay | Admitting: Family Medicine

## 2020-07-09 DIAGNOSIS — M25559 Pain in unspecified hip: Secondary | ICD-10-CM

## 2020-07-09 NOTE — Telephone Encounter (Signed)
Referral placed. Please let us know if she has not heard from the orthopedic office in 2 weeks time.

## 2020-07-09 NOTE — Telephone Encounter (Signed)
The patient was told that if she isn't feeling any better to contact the office to get a referral to orthopedics.  Please advise

## 2020-07-12 ENCOUNTER — Other Ambulatory Visit: Payer: Self-pay

## 2020-07-12 ENCOUNTER — Encounter: Payer: Self-pay | Admitting: Family Medicine

## 2020-07-12 ENCOUNTER — Ambulatory Visit (INDEPENDENT_AMBULATORY_CARE_PROVIDER_SITE_OTHER): Payer: Medicare HMO | Admitting: Family Medicine

## 2020-07-12 VITALS — BP 120/60 | HR 113

## 2020-07-12 DIAGNOSIS — J019 Acute sinusitis, unspecified: Secondary | ICD-10-CM | POA: Diagnosis not present

## 2020-07-12 MED ORDER — AZITHROMYCIN 250 MG PO TABS
ORAL_TABLET | ORAL | 0 refills | Status: DC
Start: 2020-07-12 — End: 2021-01-10

## 2020-07-12 NOTE — Progress Notes (Signed)
   Subjective:    Patient ID: Destiny Robbins, female    DOB: 1946/03/18, 74 y.o.   MRN: [id number]  HPI Here for 5 days of URI symptoms that have changed. Last week she was taking care of some grandchildren who developed a bad cough, and they were diagnosed with RSV. Then she developed a hard dry cough that lasted 3 days. Now the cough has subsided but she has sinus congestion and PND. No fever or ST. No chest pain or SOB. No body aches or NVD.    Review of Systems  Constitutional: Negative.   HENT: Positive for congestion, postnasal drip and sinus pressure. Negative for sore throat.   Eyes: Negative.   Respiratory: Negative.   Gastrointestinal: Negative.        Objective:   Physical Exam Constitutional:      General: She is not in acute distress.    Appearance: Normal appearance.  HENT:     Right Ear: Tympanic membrane, ear canal and external ear normal.     Left Ear: Tympanic membrane, ear canal and external ear normal.     Nose: Nose normal.     Mouth/Throat:     Pharynx: Oropharynx is clear.  Eyes:     Conjunctiva/sclera: Conjunctivae normal.  Cardiovascular:     Rate and Rhythm: Normal rate and regular rhythm.     Pulses: Normal pulses.     Heart sounds: Normal heart sounds.  Pulmonary:     Effort: Pulmonary effort is normal.     Breath sounds: Normal breath sounds.  Lymphadenopathy:     Cervical: No cervical adenopathy.  Neurological:     Mental Status: She is alert.           Assessment & Plan:  She had a viral URI (likely RSV) that has evolved into a sinusitis. Treat with a Zpack. Recheck prn. Alysia Penna, MD

## 2020-07-12 NOTE — Telephone Encounter (Signed)
Patient informed of the message below.

## 2020-07-22 ENCOUNTER — Ambulatory Visit: Payer: Medicare HMO | Admitting: Orthopaedic Surgery

## 2020-07-22 ENCOUNTER — Ambulatory Visit (INDEPENDENT_AMBULATORY_CARE_PROVIDER_SITE_OTHER): Payer: Medicare HMO

## 2020-07-22 ENCOUNTER — Encounter: Payer: Self-pay | Admitting: Orthopaedic Surgery

## 2020-07-22 ENCOUNTER — Ambulatory Visit: Payer: Self-pay

## 2020-07-22 DIAGNOSIS — M25551 Pain in right hip: Secondary | ICD-10-CM | POA: Diagnosis not present

## 2020-07-22 NOTE — Progress Notes (Signed)
Office Visit Note   Patient: Destiny Robbins           Date of Birth: 27-May-1946           MRN: [id number] Visit Date: 07/22/2020              Requested by: Caren Macadam, MD [place],  [place] [address] PCP: Caren Macadam, MD   Assessment & Plan: Visit Diagnoses:  1. Pain in right hip     Plan: Impression is bilateral hip early arthritis.  The patient does have a very unusual presentation of symptoms but I believe proceeding with diagnostic and hopeful therapeutic cortisone injection to the right more symptomatic hip will be most beneficial.  She will follow up with [place] as needed.  Follow-Up Instructions: Return if symptoms worsen or fail to improve.   Orders:  Orders Placed This Encounter  Procedures   XR HIP UNILAT W OR W/O PELVIS 2-3 VIEWS RIGHT   US Guided Needle Placement - No Linked Charges   No orders of the defined types were placed in this encounter.     Procedures: No procedures performed   Clinical Data: No additional findings.   Subjective: Chief Complaint  Patient presents with   Left Hip - Pain   Right Hip - Pain    HPI patient is a pleasant 74 year old female who comes in today with bilateral hip pain.  The right is currently worse than the left but occasionally left is worse than the right.  She has had this type of pain for the past 30 years.  She never had a specific injury.  The pain is described as deep within the groin which is worse when she is putting all of her weight on 1 leg.  She also has increased pain with walking as well as sleeping on either side.  She denies any weakness to either leg.  No numbness, tingling or burning.  She has tried over-the-counter pain medications without relief of symptoms.  No previous cortisone injection to the hip joint.  She did see her PCP where the left trochanteric bursa was injected.  She was not having pain at the time and is unsure whether this helped her symptoms at  all.  Review of Systems as detailed in HPI.  All others reviewed and are negative.   Objective: Vital Signs: There were no vitals taken for this visit.  Physical Exam well-developed well-nourished female no acute distress.  Alert oriented x3.  Ortho Exam examination of both hips reveals a negative logroll and negative FADIR.  Mild tenderness to the trochanteric bursa both sides.  Negative straight leg raise.  She is neurovascular intact distally.  Specialty Comments:  No specialty comments available.  Imaging: XR HIP UNILAT W OR W/O PELVIS 2-3 VIEWS RIGHT  Result Date: 07/22/2020 X-rays demonstrate mild degenerative changes    PMFS History: Patient Active Problem List   Diagnosis Date Noted   Anxiety 01/19/2019   Hyperglycemia 01/19/2019   Vitamin D deficiency 01/19/2019   Chronic daily headache 10/28/2014   Depression, recurrent ([place]) 10/28/2014   Past Medical History:  Diagnosis Date   Anxiety    Chicken pox    Chronic daily headache 10/28/2014   Colon polyp    Depression, recurrent ([place]) 10/28/2014   Hyperglycemia 01/19/2019   UTI (urinary tract infection)    Vitamin D deficiency 01/19/2019    Family History  Problem Relation Age of Onset   Other Mother  bowel obstruction   Healthy Father     Past Surgical History:  Procedure Laterality Date   BREAST ENHANCEMENT SURGERY     BREAST IMPLANT REMOVAL     BUNIONECTOMY Right    EXCISION MORTON'S NEUROMA Right    TOTAL ABDOMINAL HYSTERECTOMY  2001   abnormal bleeding/spotting. hx of abnormal pap. released from further pap testing   Social History   Occupational History   Occupation: retired   Tobacco Use   Smoking status: Never Smoker   Smokeless tobacco: Never Used  Substance and Sexual Activity   Alcohol use: Yes    Alcohol/week: 0.0 standard drinks    Comment: occ; about 4 times per year    Drug use: No   Sexual activity: Not on file

## 2020-07-22 NOTE — Progress Notes (Signed)
Subjective: Patient is here for ultrasound-guided intra-articular right hip injection.   Groin pain, here for diagnostic injection.  Objective:  Good ROM with minimal pain on IR.  Procedure: Ultrasound-guided right hip injection: After sterile prep with Betadine, injected 8 cc 1% lidocaine without epinephrine and 40 mg methylprednisolone using a 22-gauge spinal needle, passing the needle through the iliofemoral ligament into the femoral head/neck junction.  Injectate seen filling joint capsule.  No immediate change, but she wasn't hurting much this morning.

## 2020-07-26 DIAGNOSIS — R69 Illness, unspecified: Secondary | ICD-10-CM | POA: Diagnosis not present

## 2020-08-23 ENCOUNTER — Ambulatory Visit (INDEPENDENT_AMBULATORY_CARE_PROVIDER_SITE_OTHER): Payer: Medicare HMO | Admitting: Family Medicine

## 2020-08-23 ENCOUNTER — Other Ambulatory Visit: Payer: Self-pay

## 2020-08-23 ENCOUNTER — Encounter: Payer: Self-pay | Admitting: Family Medicine

## 2020-08-23 DIAGNOSIS — M25551 Pain in right hip: Secondary | ICD-10-CM

## 2020-08-23 NOTE — Progress Notes (Signed)
   Office Visit Note   Patient: Destiny Robbins           Date of Birth: 12-20-1945           MRN: [id number] Visit Date: 08/23/2020 Requested by: Caren Macadam, MD [place],  [place] [address] PCP: Caren Macadam, MD  Subjective: Chief Complaint  Patient presents with  . Right Hip - Pain    HPI: She is here with persistent right hip pain.  Intra-articular injection did not help at all.  She states that her pain has been intermittently present for many years.  Sometimes it will be absent for several months, and then it seems to come back without any specific injury.  It is almost always on the lateral aspect of her hip.  Sometimes the left hip hurts, but today it is the right hip.               ROS:   All other systems were reviewed and are negative.  Objective: Vital Signs: There were no vitals taken for this visit.  Physical Exam:  General:  Alert and oriented, in no acute distress. Pulm:  Breathing unlabored. Psy:  Normal mood, congruent affect.  Right hip: Negative straight leg raise, no pain with internal or external hip rotation today.  Lower extremity strength and reflexes are normal.  She is slightly tender near the TFL especially near the greater trochanter.  Stork test is equivocal on the right, negative on the left.  Imaging: No results found.  Assessment & Plan: 1.  Chronic intermittent right hip pain, etiology uncertain.  Could be weakness of the hip abductors, cannot rule out lumbar stenosis. -Since her pain is manageable right now she wants to try strengthening exercises at home and at the gym.  If symptoms worsen, then we will order x-rays and MRI scan of the lumbar spine.     Procedures: No procedures performed  No notes on file     PMFS History: Patient Active Problem List   Diagnosis Date Noted  . Anxiety 01/19/2019  . Hyperglycemia 01/19/2019  . Vitamin D deficiency 01/19/2019  . Chronic daily headache  10/28/2014  . Depression, recurrent ([place]) 10/28/2014   Past Medical History:  Diagnosis Date  . Anxiety   . Chicken pox   . Chronic daily headache 10/28/2014  . Colon polyp   . Depression, recurrent ([place]) 10/28/2014  . Hyperglycemia 01/19/2019  . UTI (urinary tract infection)   . Vitamin D deficiency 01/19/2019    Family History  Problem Relation Age of Onset  . Other Mother        bowel obstruction  . Healthy Father     Past Surgical History:  Procedure Laterality Date  . BREAST ENHANCEMENT SURGERY    . BREAST IMPLANT REMOVAL    . BUNIONECTOMY Right   . EXCISION MORTON'S NEUROMA Right   . TOTAL ABDOMINAL HYSTERECTOMY  2001   abnormal bleeding/spotting. hx of abnormal pap. released from further pap testing   Social History   Occupational History  . Occupation: retired   Tobacco Use  . Smoking status: Never Smoker  . Smokeless tobacco: Never Used  Substance and Sexual Activity  . Alcohol use: Yes    Alcohol/week: 0.0 standard drinks    Comment: occ; about 4 times per year   . Drug use: No  . Sexual activity: Not on file

## 2020-08-30 ENCOUNTER — Ambulatory Visit: Payer: Medicare HMO | Admitting: Orthopedic Surgery

## 2020-08-31 ENCOUNTER — Telehealth: Payer: Self-pay | Admitting: Family Medicine

## 2020-08-31 MED ORDER — MELOXICAM 15 MG PO TABS: 7.5000 mg | ORAL_TABLET | Freq: Every day | ORAL | 6 refills | Status: DC | PRN

## 2020-08-31 NOTE — Telephone Encounter (Signed)
Please advise 

## 2020-08-31 NOTE — Telephone Encounter (Signed)
I called and advised the patient. 

## 2020-08-31 NOTE — Telephone Encounter (Signed)
Patient called requesting a new medication. Patient is asking for meloxicam. Please send to pharmacy on file. Patient phone number is [phone].

## 2020-08-31 NOTE — Telephone Encounter (Signed)
Rx sent 

## 2020-11-15 ENCOUNTER — Telehealth: Payer: Self-pay | Admitting: Family Medicine

## 2020-11-15 NOTE — Telephone Encounter (Signed)
Left message for patient to call back and schedule Medicare Annual Wellness Visit (AWV) either virtually or in office.   Last AWV 11/17/19 please schedule at anytime with [facility] Nurse Health Advisor 1 or 2   This should be a 45 minute visit. 

## 2021-01-10 ENCOUNTER — Ambulatory Visit (INDEPENDENT_AMBULATORY_CARE_PROVIDER_SITE_OTHER): Payer: Medicare HMO | Admitting: Family Medicine

## 2021-01-10 ENCOUNTER — Encounter: Payer: Self-pay | Admitting: Family Medicine

## 2021-01-10 ENCOUNTER — Other Ambulatory Visit: Payer: Self-pay

## 2021-01-10 VITALS — BP 104/60 | HR 110 | Temp 98.1°F | Ht 65.0 in | Wt 148.4 lb

## 2021-01-10 DIAGNOSIS — R232 Flushing: Secondary | ICD-10-CM | POA: Diagnosis not present

## 2021-01-10 DIAGNOSIS — D72829 Elevated white blood cell count, unspecified: Secondary | ICD-10-CM

## 2021-01-10 DIAGNOSIS — R944 Abnormal results of kidney function studies: Secondary | ICD-10-CM | POA: Diagnosis not present

## 2021-01-10 DIAGNOSIS — M7918 Myalgia, other site: Secondary | ICD-10-CM | POA: Diagnosis not present

## 2021-01-10 DIAGNOSIS — Z13220 Encounter for screening for lipoid disorders: Secondary | ICD-10-CM

## 2021-01-10 DIAGNOSIS — Z1231 Encounter for screening mammogram for malignant neoplasm of breast: Secondary | ICD-10-CM | POA: Diagnosis not present

## 2021-01-10 LAB — LDL CHOLESTEROL, DIRECT: Direct LDL: 154 mg/dL

## 2021-01-10 LAB — COMPREHENSIVE METABOLIC PANEL
ALT: 18 U/L (ref 0–35)
AST: 15 U/L (ref 0–37)
Albumin: 4.2 g/dL (ref 3.5–5.2)
Alkaline Phosphatase: 51 U/L (ref 39–117)
BUN: 31 mg/dL — ABNORMAL HIGH (ref 6–23)
CO2: 28 mEq/L (ref 19–32)
Calcium: 9.6 mg/dL (ref 8.4–10.5)
Chloride: 102 mEq/L (ref 96–112)
Creatinine, Ser: 1.33 mg/dL — ABNORMAL HIGH (ref 0.40–1.20)
GFR: 40.03 mL/min — ABNORMAL LOW (ref >60.00)
Glucose, Bld: 94 mg/dL (ref 70–99)
Potassium: 4.5 mEq/L (ref 3.5–5.1)
Sodium: 139 mEq/L (ref 135–145)
Total Bilirubin: 0.5 mg/dL (ref 0.2–1.2)
Total Protein: 7.7 g/dL (ref 6.0–8.3)

## 2021-01-10 LAB — CBC WITH DIFFERENTIAL/PLATELET
Basophils Absolute: 0.1 10*3/uL (ref 0.0–0.1)
Basophils Relative: 0.5 % (ref 0.0–3.0)
Eosinophils Absolute: 0.1 10*3/uL (ref 0.0–0.7)
Eosinophils Relative: 1.1 % (ref 0.0–5.0)
HCT: 40 % (ref 36.0–46.0)
Hemoglobin: 13.6 g/dL (ref 12.0–15.0)
Lymphocytes Relative: 36 % (ref 12.0–46.0)
Lymphs Abs: 4.3 10*3/uL — ABNORMAL HIGH (ref 0.7–4.0)
MCHC: 34 g/dL (ref 30.0–36.0)
MCV: 92.4 fl (ref 78.0–100.0)
Monocytes Absolute: 0.9 10*3/uL (ref 0.1–1.0)
Monocytes Relative: 7.5 % (ref 3.0–12.0)
Neutro Abs: 6.6 10*3/uL (ref 1.4–7.7)
Neutrophils Relative %: 54.9 % (ref 43.0–77.0)
Platelets: 340 10*3/uL (ref 150.0–400.0)
RBC: 4.33 Mil/uL (ref 3.87–5.11)
RDW: 13.9 % (ref 11.5–15.5)
WBC: 12 10*3/uL — ABNORMAL HIGH (ref 4.0–10.5)

## 2021-01-10 LAB — LIPID PANEL
Cholesterol: 234 mg/dL — ABNORMAL HIGH (ref 0–200)
HDL: 53.3 mg/dL (ref >39.00)
NonHDL: 180.29
Total CHOL/HDL Ratio: 4
Triglycerides: 261 mg/dL — ABNORMAL HIGH (ref 0.0–149.0)
VLDL: 52.2 mg/dL — ABNORMAL HIGH (ref 0.0–40.0)

## 2021-01-10 LAB — TSH: TSH: 2.86 u[IU]/mL (ref 0.35–4.50)

## 2021-01-10 MED ORDER — TIZANIDINE HCL 4 MG PO CAPS: 4.0000 mg | ORAL_CAPSULE | Freq: Two times a day (BID) | ORAL | 0 refills | Status: DC | PRN

## 2021-01-10 NOTE — Addendum Note (Signed)
Addended by: Marrion Coy on: 01/10/2021 09:17 AM   Modules accepted: Orders

## 2021-01-10 NOTE — Patient Instructions (Addendum)
Work on stretches daily with hip/buttocks. Try flexeril at bedtime for a couple of weeks. If this is not helping let me know. If you aren't getting some improvement in a week or so then consider trying meloxicam daily.   I will call you once I get bloodwork results.

## 2021-01-10 NOTE — Progress Notes (Signed)
Celestine Foxworth Steedman DOB: 1946/07/16 Encounter date: 01/10/2021  This is a 75 y.o. female who presents with Chief Complaint  Patient presents with  . Hot Flashes worsening for years   . Hip Pain    Patient complains of left buttock and hip pain x4 days noticed after riding in a car for 2 hours, no known injury    History of present illness: States that her answers for phq have been going on for her whole life.   Feels like hot flashes are worse; going on all the time. Thought this would get better with age, but feels like it is getting worse.   States she was talking with cousin who got hormones and felt better.   Came off of hormones around age 89 (took these after hysterectomy) - and everything else she tried after that didn't really help, so just quit altogether. Have been bad since coming off hormones. Does go through times where she has less and hopes it will get better. Did even try ssri and snri (effexor) which helped the most, but made mood worse.   Getting them day/night. Has tried cut out caffeine.   Rode to see daughter - and was in car 2 hours - there 45 minutes and then back 2 hours. Could barely walk next day. Right hip cleared up, but left SI area really hurts with stepping/walking. Heat helped. No radiation; just localized to left SI/gluteal area. This morning has noted some pain lateral to anterior. Feels like this escalates her depression. Not bothering her at night. Does ok if off of it. Just walking that flares it.   Not feeling more fatigued than baseline. She does overeat - has put on weight. Blaming on COVID. Snacking all the time.    Allergies  Allergen Reactions  . Vicodin [Hydrocodone-Acetaminophen] Rash   Current Meds  Medication Sig  . tiZANidine (ZANAFLEX) 4 MG capsule Take 1 capsule (4 mg total) by mouth 2 (two) times daily as needed for muscle spasms.    Review of Systems  Constitutional: Positive for fatigue. Negative for chills and fever.   Respiratory: Negative for cough, chest tightness, shortness of breath and wheezing.   Cardiovascular: Negative for chest pain, palpitations and leg swelling.  Psychiatric/Behavioral: The patient is nervous/anxious.     Objective:  BP 104/60 (BP Location: Left Arm, Patient Position: Sitting, Cuff Size: Normal)   Pulse (!) 110   Temp 98.1 F (36.7 C) (Oral)   Ht 5\' 5"  (1.651 m)   Wt 148 lb 6.4 oz (67.3 kg)   BMI 24.70 kg/m   Weight: 148 lb 6.4 oz (67.3 kg)   BP Readings from Last 3 Encounters:  01/10/21 104/60  07/12/20 120/60  05/26/20 120/70   Wt Readings from Last 3 Encounters:  01/10/21 148 lb 6.4 oz (67.3 kg)  05/26/20 148 lb 8 oz (67.4 kg)  01/23/20 143 lb (64.9 kg)    Physical Exam Constitutional:      General: She is not in acute distress.    Appearance: She is well-developed.  Cardiovascular:     Rate and Rhythm: Normal rate and regular rhythm.     Heart sounds: Normal heart sounds. No murmur heard. No friction rub.  Pulmonary:     Effort: Pulmonary effort is normal. No respiratory distress.     Breath sounds: Normal breath sounds. No wheezing or rales.  Musculoskeletal:     Right lower leg: No edema.     Left lower leg: No edema.  Lymphadenopathy:  Head:     Right side of head: No submental or submandibular adenopathy.     Left side of head: No submental or submandibular adenopathy.     Cervical: No cervical adenopathy.  Neurological:     Mental Status: She is alert and oriented to person, place, and time.  Psychiatric:        Behavior: Behavior normal.     Assessment/Plan  1. Hot flashes We are starting with bloodwork. These are not new; just have been present since she stopped hormone. Discussed need for normal bloodwork and normal mammogram before considering hormone.  - CBC with Differential/Platelet; Future - TSH; Future  2. Gluteal pain Stretches, muscle relaxer, anti-inflammatory if not improving.  - tiZANidine (ZANAFLEX) 4 MG capsule;  Take 1 capsule (4 mg total) by mouth 2 (two) times daily as needed for muscle spasms.  Dispense: 30 capsule; Refill: 0  3. Encounter for screening mammogram for malignant neoplasm of breast - MM Digital Screening; Future  4. Lipid screening - Comprehensive metabolic panel; Future - Lipid panel; Future   Return for pending bloodwork.     Micheline Rough, MD

## 2021-01-11 ENCOUNTER — Ambulatory Visit
Admission: RE | Admit: 2021-01-11 | Discharge: 2021-01-11 | Disposition: A | Discharge status: Home / Self Care | Payer: Medicare HMO | Source: Ambulatory Visit | Attending: Family Medicine | Admitting: Family Medicine

## 2021-01-11 DIAGNOSIS — Z1231 Encounter for screening mammogram for malignant neoplasm of breast: Secondary | ICD-10-CM

## 2021-01-13 NOTE — Addendum Note (Signed)
Addended by: Agnes Lawrence on: 01/13/2021 04:10 PM   Modules accepted: Orders

## 2021-01-19 ENCOUNTER — Ambulatory Visit: Payer: Medicare HMO

## 2021-01-20 ENCOUNTER — Other Ambulatory Visit: Payer: Self-pay | Admitting: Family Medicine

## 2021-01-20 ENCOUNTER — Encounter: Payer: Self-pay | Admitting: Family Medicine

## 2021-01-20 ENCOUNTER — Telehealth: Payer: Self-pay | Admitting: Family Medicine

## 2021-01-20 MED ORDER — ESTRADIOL 0.5 MG PO TABS: 0.5000 mg | ORAL_TABLET | Freq: Every day | ORAL | 2 refills | Status: DC

## 2021-01-20 NOTE — Telephone Encounter (Signed)
Patient is calling and stated that after her labs provider was suppose to prescribe her medication for her hormones, please advise. CB is [phone]

## 2021-01-20 NOTE — Telephone Encounter (Signed)
Noted  

## 2021-01-20 NOTE — Telephone Encounter (Signed)
Pt called back to cancel the previous msg due to the medication was sent as she was leaving the msg.

## 2021-02-02 ENCOUNTER — Other Ambulatory Visit: Payer: Self-pay

## 2021-02-02 ENCOUNTER — Ambulatory Visit (HOSPITAL_COMMUNITY)
Admission: EM | Admit: 2021-02-02 | Discharge: 2021-02-02 | Disposition: A | Discharge status: Home / Self Care | Payer: Medicare HMO | Attending: Urgent Care | Admitting: Urgent Care

## 2021-02-02 ENCOUNTER — Encounter (HOSPITAL_COMMUNITY): Payer: Self-pay | Admitting: Urgent Care

## 2021-02-02 DIAGNOSIS — R112 Nausea with vomiting, unspecified: Secondary | ICD-10-CM | POA: Diagnosis not present

## 2021-02-02 DIAGNOSIS — R197 Diarrhea, unspecified: Secondary | ICD-10-CM

## 2021-02-02 DIAGNOSIS — N3001 Acute cystitis with hematuria: Secondary | ICD-10-CM

## 2021-02-02 LAB — POCT URINALYSIS DIPSTICK, ED / UC
Glucose, UA: NEGATIVE mg/dL
Ketones, ur: 15 mg/dL — AB
Nitrite: POSITIVE — AB
Protein, ur: 30 mg/dL — AB
Specific Gravity, Urine: 1.02 (ref 1.005–1.030)
Urobilinogen, UA: 0.2 mg/dL (ref 0.0–1.0)
pH: 5.5 (ref 5.0–8.0)

## 2021-02-02 MED ORDER — ACETAMINOPHEN 325 MG PO TABS
ORAL_TABLET | ORAL | Status: AC
  Filled 2021-02-02: qty 2

## 2021-02-02 MED ORDER — ONDANSETRON 4 MG PO TBDP
4.0000 mg | ORAL_TABLET | Freq: Once | ORAL | Status: AC
  Administered 2021-02-02: 4 mg via ORAL

## 2021-02-02 MED ORDER — LOPERAMIDE HCL 2 MG PO CAPS: 2.0000 mg | ORAL_CAPSULE | Freq: Two times a day (BID) | ORAL | 0 refills | Status: DC | PRN

## 2021-02-02 MED ORDER — CEPHALEXIN 500 MG PO CAPS: 500.0000 mg | ORAL_CAPSULE | Freq: Two times a day (BID) | ORAL | 0 refills | Status: DC

## 2021-02-02 MED ORDER — ONDANSETRON 8 MG PO TBDP: 8.0000 mg | ORAL_TABLET | Freq: Three times a day (TID) | ORAL | 0 refills | Status: DC | PRN

## 2021-02-02 MED ORDER — ONDANSETRON 4 MG PO TBDP
ORAL_TABLET | ORAL | Status: AC
  Filled 2021-02-02: qty 1

## 2021-02-02 MED ORDER — ACETAMINOPHEN 325 MG PO TABS
650.0000 mg | ORAL_TABLET | Freq: Once | ORAL | Status: AC
  Administered 2021-02-02: 650 mg via ORAL

## 2021-02-02 NOTE — Discharge Instructions (Addendum)

## 2021-02-02 NOTE — ED Provider Notes (Signed)
[place]   MRN: [id number] DOB: May 27, 1946  Subjective:   Destiny Robbins is a 75 y.o. female presenting for 1 day history of acute onset nausea, vomiting, diarrhea.  Patient states that the symptoms started right after she took [place].  This was prompted by having some urinary urgency and incomplete emptying of her bladder.  Denies fever, chest pain, shortness of breath, flank pain, hematuria, dysuria.  Patient admits that she does not drink water, drinks soda and tea.  Denies a history of urinary issues, kidney stones, GI issues such as Crohn's or ulcerative colitis.  Has kept her dietary routine the same.  No current facility-administered medications for this encounter.  Current Outpatient Medications:  .  estradiol (ESTRACE) 0.5 MG tablet, Take 1 tablet (0.5 mg total) by mouth daily., Disp: 30 tablet, Rfl: 2 .  tiZANidine (ZANAFLEX) 4 MG capsule, Take 1 capsule (4 mg total) by mouth 2 (two) times daily as needed for muscle spasms., Disp: 30 capsule, Rfl: 0   Allergies  Allergen Reactions  . Vicodin [Hydrocodone-Acetaminophen] Rash    Past Medical History:  Diagnosis Date  . Anxiety   . Chicken pox   . Chronic daily headache 10/28/2014  . Colon polyp   . Depression, recurrent ([place]) 10/28/2014  . Hyperglycemia 01/19/2019  . UTI (urinary tract infection)   . Vitamin D deficiency 01/19/2019     Past Surgical History:  Procedure Laterality Date  . AUGMENTATION MAMMAPLASTY     had impants removed and lift done  . BREAST ENHANCEMENT SURGERY    . BREAST IMPLANT REMOVAL    . BUNIONECTOMY Right   . EXCISION MORTON'S NEUROMA Right   . TOTAL ABDOMINAL HYSTERECTOMY  2001   abnormal bleeding/spotting. hx of abnormal pap. released from further pap testing    Family History  Problem Relation Age of Onset  . Other Mother        bowel obstruction  . Healthy Father     Social History   Tobacco Use  . Smoking status: Never Smoker  . Smokeless tobacco:  Never Used  Substance Use Topics  . Alcohol use: Yes    Alcohol/week: 0.0 standard drinks    Comment: occ; about 4 times per year   . Drug use: No    ROS   Objective:   Vitals: BP (!) 101/52 (BP Location: Left Arm)   Pulse (!) 124   Temp (!) 100.5 F (38.1 C) (Oral)   Resp 18   SpO2 99%   BP Readings from Last 3 Encounters:  02/02/21 (!) 101/52  01/10/21 104/60  07/12/20 120/60   Physical Exam Constitutional:      General: She is not in acute distress.    Appearance: Normal appearance. She is well-developed and normal weight. She is ill-appearing. She is not toxic-appearing or diaphoretic.  HENT:     Head: Normocephalic and atraumatic.     Right Ear: External ear normal.     Left Ear: External ear normal.     Nose: Nose normal.     Mouth/Throat:     Mouth: Mucous membranes are moist.     Pharynx: Oropharynx is clear.  Eyes:     General: No scleral icterus.    Extraocular Movements: Extraocular movements intact.     Pupils: Pupils are equal, round, and reactive to light.  Cardiovascular:     Rate and Rhythm: Normal rate and regular rhythm.     Heart sounds: Normal heart sounds. No murmur  heard. No friction rub. No gallop.   Pulmonary:     Effort: Pulmonary effort is normal. No respiratory distress.     Breath sounds: Normal breath sounds. No stridor. No wheezing, rhonchi or rales.  Abdominal:     General: Bowel sounds are normal. There is no distension.     Palpations: Abdomen is soft. There is no mass.     Tenderness: There is no abdominal tenderness. There is no right CVA tenderness, left CVA tenderness, guarding or rebound.  Skin:    General: Skin is warm and dry.     Coloration: Skin is not pale.     Findings: No rash.  Neurological:     General: No focal deficit present.     Mental Status: She is alert and oriented to person, place, and time.  Psychiatric:        Mood and Affect: Mood normal.        Behavior: Behavior normal.        Thought Content:  Thought content normal.        Judgment: Judgment normal.     Results for orders placed or performed during the hospital encounter of 02/02/21 (from the past 24 hour(s))  POCT Urinalysis Dipstick (ED/UC)     Status: Abnormal   Collection Time: 02/02/21  8:57 AM  Result Value Ref Range   Glucose, UA NEGATIVE NEGATIVE mg/dL   Bilirubin Urine SMALL (A) NEGATIVE   Ketones, ur 15 (A) NEGATIVE mg/dL   Specific Gravity, Urine 1.020 1.005 - 1.030   Hgb urine dipstick MODERATE (A) NEGATIVE   pH 5.5 5.0 - 8.0   Protein, ur 30 (A) NEGATIVE mg/dL   Urobilinogen, UA 0.2 0.0 - 1.0 mg/dL   Nitrite POSITIVE (A) NEGATIVE   Leukocytes,Ua TRACE (A) NEGATIVE    Assessment and Plan :   PDMP not reviewed this encounter.  1. Acute cystitis with hematuria   2. Nausea vomiting and diarrhea     Start Keflex to cover for acute cystitis, urine culture pending.  Recommended aggressive hydration, limiting urinary irritants.  We will also have her use Zofran and loperamide as needed.  Counseled patient on potential for adverse effects with medications prescribed/recommended today, ER and return-to-clinic precautions discussed, patient verbalized understanding.    Jaynee Eagles, [place] 02/02/21 [phone]

## 2021-02-02 NOTE — ED Triage Notes (Addendum)
Pt c/o acute nausea/diarrhea onset last night at approx 1800. Pt states she took Uristat yesterday b/c "her old age she was peeing more often". States she was awake all night with n/v/d and chills.   Denies pain with urination, hematuria, urgency, flank/abdominal/back pain, recent URI symptoms or exposure to people with similar illness symptoms.  Pt falling asleep while sitting up, requesting to lay back 2/2 fatigue/ill feeling. Denies dizziness, but feels "off balance and unstable when standing or walking".  Pt states she tolerates tylenol (plain) w/o allergy or side effect.

## 2021-02-02 NOTE — ED Notes (Signed)
Pt was able to ambulate to bathroom independently w/o complication and provide urine sample. No emesis since taking zofran.

## 2021-02-09 ENCOUNTER — Telehealth: Payer: Self-pay | Admitting: Family Medicine

## 2021-02-09 ENCOUNTER — Other Ambulatory Visit: Payer: Self-pay | Admitting: Family Medicine

## 2021-02-09 MED ORDER — ESTRADIOL 1 MG PO TABS: 0.5000 mg | ORAL_TABLET | Freq: Every day | ORAL | 1 refills | Status: DC

## 2021-02-09 NOTE — Telephone Encounter (Signed)
Pt call and stated she want a increase on her  estradiol (ESTRACE) 0.5 MG tablet from .5 to 1mg  .

## 2021-02-09 NOTE — Telephone Encounter (Signed)
Patient informed of the message below.

## 2021-02-09 NOTE — Telephone Encounter (Signed)
Ok. New dose has been sent in.

## 2021-02-10 ENCOUNTER — Other Ambulatory Visit: Payer: Self-pay

## 2021-02-10 ENCOUNTER — Other Ambulatory Visit (INDEPENDENT_AMBULATORY_CARE_PROVIDER_SITE_OTHER): Payer: Medicare HMO

## 2021-02-10 DIAGNOSIS — D72829 Elevated white blood cell count, unspecified: Secondary | ICD-10-CM

## 2021-02-10 DIAGNOSIS — R944 Abnormal results of kidney function studies: Secondary | ICD-10-CM

## 2021-02-10 LAB — CBC WITH DIFFERENTIAL/PLATELET
Basophils Absolute: 0 10*3/uL (ref 0.0–0.1)
Basophils Relative: 0.4 % (ref 0.0–3.0)
Eosinophils Absolute: 0.3 10*3/uL (ref 0.0–0.7)
Eosinophils Relative: 2.7 % (ref 0.0–5.0)
HCT: 37.5 % (ref 36.0–46.0)
Hemoglobin: 12.8 g/dL (ref 12.0–15.0)
Lymphocytes Relative: 38.8 % (ref 12.0–46.0)
Lymphs Abs: 4.1 10*3/uL — ABNORMAL HIGH (ref 0.7–4.0)
MCHC: 34.2 g/dL (ref 30.0–36.0)
MCV: 92.2 fl (ref 78.0–100.0)
Monocytes Absolute: 0.6 10*3/uL (ref 0.1–1.0)
Monocytes Relative: 5.5 % (ref 3.0–12.0)
Neutro Abs: 5.5 10*3/uL (ref 1.4–7.7)
Neutrophils Relative %: 52.6 % (ref 43.0–77.0)
Platelets: 358 10*3/uL (ref 150.0–400.0)
RBC: 4.06 Mil/uL (ref 3.87–5.11)
RDW: 13.1 % (ref 11.5–15.5)
WBC: 10.5 10*3/uL (ref 4.0–10.5)

## 2021-02-10 LAB — BASIC METABOLIC PANEL
BUN: 10 mg/dL (ref 6–23)
CO2: 28 mEq/L (ref 19–32)
Calcium: 9.4 mg/dL (ref 8.4–10.5)
Chloride: 98 mEq/L (ref 96–112)
Creatinine, Ser: 0.86 mg/dL (ref 0.40–1.20)
GFR: 67.5 mL/min (ref >60.00)
Glucose, Bld: 94 mg/dL (ref 70–99)
Potassium: 4.8 mEq/L (ref 3.5–5.1)
Sodium: 135 mEq/L (ref 135–145)

## 2021-03-18 ENCOUNTER — Telehealth: Payer: Self-pay | Admitting: Family Medicine

## 2021-03-18 NOTE — Telephone Encounter (Signed)
Patient came into the office today to explain that the directions on her Estradiol medication is wrong and wanted to make sure she is taking the medication correctly. Patient been taking 1mg  tablet once daily vs taking 0.5 tablet once daily and the 1mg  is working better. Patient would like office to call pharmacy to change directions so it can be right on the label.

## 2021-03-21 ENCOUNTER — Other Ambulatory Visit: Payer: Self-pay | Admitting: Family Medicine

## 2021-03-21 MED ORDER — ESTRADIOL 1 MG PO TABS: 1.0000 mg | ORAL_TABLET | Freq: Every day | ORAL | 1 refills | Status: DC

## 2021-03-21 NOTE — Telephone Encounter (Signed)
If 1mg  works better that is fine. I have changed the rx in our system. Doesn't look like she is due for refill until June so I did not send in new rx with proper sig yet, but if June is right for her refill; let me know and I'll send it with a date to hold until she needs it.

## 2021-03-21 NOTE — Telephone Encounter (Signed)
Spoke with the pt and informed her of the message below.  Patient stated she only has 10 pills left and needs a refill at this time.  Message sent to PCP.

## 2021-03-23 ENCOUNTER — Other Ambulatory Visit: Payer: Self-pay | Admitting: Family Medicine

## 2021-03-23 MED ORDER — ESTRADIOL 1 MG PO TABS: 1.0000 mg | ORAL_TABLET | Freq: Every day | ORAL | 1 refills | Status: DC

## 2021-03-23 NOTE — Telephone Encounter (Signed)
Noted  

## 2021-03-23 NOTE — Telephone Encounter (Signed)
Ok. Refill has been sent.

## 2021-04-20 ENCOUNTER — Encounter: Payer: Self-pay | Admitting: Family Medicine

## 2021-04-20 ENCOUNTER — Other Ambulatory Visit: Payer: Self-pay

## 2021-04-20 ENCOUNTER — Ambulatory Visit (INDEPENDENT_AMBULATORY_CARE_PROVIDER_SITE_OTHER): Payer: Medicare HMO | Admitting: Family Medicine

## 2021-04-20 VITALS — BP 122/80 | HR 91 | Temp 97.8°F | Ht 65.0 in | Wt 145.0 lb

## 2021-04-20 DIAGNOSIS — R232 Flushing: Secondary | ICD-10-CM

## 2021-04-20 DIAGNOSIS — R5383 Other fatigue: Secondary | ICD-10-CM | POA: Diagnosis not present

## 2021-04-20 DIAGNOSIS — R32 Unspecified urinary incontinence: Secondary | ICD-10-CM

## 2021-04-20 DIAGNOSIS — M2550 Pain in unspecified joint: Secondary | ICD-10-CM

## 2021-04-20 DIAGNOSIS — R319 Hematuria, unspecified: Secondary | ICD-10-CM

## 2021-04-20 LAB — POC URINALSYSI DIPSTICK (AUTOMATED)
Bilirubin, UA: NEGATIVE
Glucose, UA: NEGATIVE
Ketones, UA: NEGATIVE
Leukocytes, UA: NEGATIVE
Nitrite, UA: NEGATIVE
Protein, UA: NEGATIVE
Spec Grav, UA: >=1.03 — AB (ref 1.010–1.025)
Urobilinogen, UA: 0.2 E.U./dL
pH, UA: 5.5 (ref 5.0–8.0)

## 2021-04-20 NOTE — Addendum Note (Signed)
Addended by: Denna Haggard K on: 04/20/2021 11:11 AM   Modules accepted: Orders

## 2021-04-20 NOTE — Progress Notes (Signed)
Destiny Robbins DOB: 08-29-1946 Encounter date: 04/20/2021  This is a 75 y.o. female who presents with Chief Complaint  Patient presents with  . Follow-up    History of present illness: Last visit with me was 01/10/2021.  We discussed hot flashes at that time as well as hip pain.   She had a history of being on hormone replacement but stopped around age 57.  She suffered with hot flashes since that time.  She had a total abdominal hysterectomy back in 2001.  She completed a mammogram since her last visit which was normal.  We started with 0.5 mg of estradiol, but increased to 1 mg. She is not having any hot flashes at all, which has been great. Noted some little zits on her face.   Urination is an issue - notes in last year. Had bad UTI in march. Doesn't even want to go out due to this. Any straining she does then she urinates. Even with walking will leak. Goes 4 times in an hour - just keeps dribbling. No discomfort with urination. No discoloration or odor to urine. In last year has just felt more tired. Feels more out of breath with walking. Just feels like things are harder for her.   Still noting hip pain - puts blanket under hips to be comfortable. If she walks they do get painful. Also has foot pain. Not comfortable in tennis shoes. Any hard sole bothers her. Feels pain in both hips. Is hard to get started in morning - but just a few minutes.   Allergies  Allergen Reactions  . Vicodin [Hydrocodone-Acetaminophen] Rash   Current Meds  Medication Sig  . estradiol (ESTRACE) 1 MG tablet Take 1 tablet (1 mg total) by mouth daily.    Review of Systems  Constitutional: Positive for fatigue. Negative for chills and fever.  Respiratory: Positive for shortness of breath (just fatigues easier with walking). Negative for cough, chest tightness and wheezing.   Cardiovascular: Negative for chest pain, palpitations and leg swelling.  Musculoskeletal: Negative for arthralgias.     Objective:  BP 122/80 (BP Location: Left Arm, Patient Position: Sitting, Cuff Size: Normal)   Pulse 91   Temp 97.8 F (36.6 C) (Oral)   Ht 5\' 5"  (1.651 m)   Wt 145 lb (65.8 kg)   SpO2 99%   BMI 24.13 kg/m   Weight: 145 lb (65.8 kg)   BP Readings from Last 3 Encounters:  04/20/21 122/80  02/02/21 (!) 101/52  01/10/21 104/60   Wt Readings from Last 3 Encounters:  04/20/21 145 lb (65.8 kg)  01/10/21 148 lb 6.4 oz (67.3 kg)  05/26/20 148 lb 8 oz (67.4 kg)    Physical Exam Constitutional:      General: She is not in acute distress.    Appearance: She is well-developed.  Cardiovascular:     Rate and Rhythm: Normal rate and regular rhythm.     Heart sounds: Normal heart sounds. No murmur heard. No friction rub.  Pulmonary:     Effort: Pulmonary effort is normal. No respiratory distress.     Breath sounds: Normal breath sounds. No wheezing or rales.  Musculoskeletal:     Right lower leg: No edema.     Left lower leg: No edema.  Neurological:     Mental Status: She is alert and oriented to person, place, and time.  Psychiatric:        Behavior: Behavior normal.     Assessment/Plan  1. Hot flashes These  have resolved with estradiol 1mg  daily.   2. Fatigue, unspecified type ekg stable, no acute changes, sinus rhythm. Fatigue onset has been most notable in last year. We will consider further eval pending urine results. - EKG 12-Lead  3. Urinary incontinence, unspecified type Ongoing urinary sx. Hematuria today. Will send for culture. No culture done when she had symptoms in march. Would like to see sensitivities before starting treatment.  - POCT Urinalysis Dipstick (Automated) - Urine Culture; Future  4. Hematuria, unspecified type See above.  - Urine Culture; Future  5. Arthralgia, unspecified joint Consider additional evaluation for this along with fatigue. Autoimmune eval completed last year and was negative, but sx persist.     Return for pending urine  culture results.     Micheline Rough, MD

## 2021-04-21 LAB — URINE CULTURE
MICRO NUMBER:: 11933617
SPECIMEN QUALITY:: ADEQUATE

## 2021-04-27 NOTE — Addendum Note (Signed)
Addended by: Agnes Lawrence on: 04/27/2021 11:18 AM   Modules accepted: Orders

## 2021-05-02 DIAGNOSIS — L218 Other seborrheic dermatitis: Secondary | ICD-10-CM | POA: Diagnosis not present

## 2021-05-02 DIAGNOSIS — L820 Inflamed seborrheic keratosis: Secondary | ICD-10-CM | POA: Diagnosis not present

## 2021-05-02 DIAGNOSIS — L821 Other seborrheic keratosis: Secondary | ICD-10-CM | POA: Diagnosis not present

## 2021-05-02 DIAGNOSIS — L570 Actinic keratosis: Secondary | ICD-10-CM | POA: Diagnosis not present

## 2021-05-24 DIAGNOSIS — H2513 Age-related nuclear cataract, bilateral: Secondary | ICD-10-CM | POA: Diagnosis not present

## 2021-06-01 LAB — FECAL OCCULT BLOOD, IMMUNOCHEMICAL: IFOBT: NEGATIVE

## 2021-06-15 ENCOUNTER — Ambulatory Visit (INDEPENDENT_AMBULATORY_CARE_PROVIDER_SITE_OTHER): Payer: Medicare HMO | Admitting: Family Medicine

## 2021-06-15 ENCOUNTER — Other Ambulatory Visit: Payer: Self-pay

## 2021-06-15 VITALS — BP 138/64 | HR 106 | Temp 98.2°F | Wt 142.9 lb

## 2021-06-15 DIAGNOSIS — T63441A Toxic effect of venom of bees, accidental (unintentional), initial encounter: Secondary | ICD-10-CM | POA: Diagnosis not present

## 2021-06-15 MED ORDER — METHYLPREDNISOLONE ACETATE 80 MG/ML IJ SUSP
80.0000 mg | Freq: Once | INTRAMUSCULAR | Status: AC
  Administered 2021-06-15: 80 mg via INTRAMUSCULAR

## 2021-06-15 NOTE — Progress Notes (Signed)
Established Patient Office Visit  Subjective:  Patient ID: Destiny Robbins, female    DOB: Jun 20, 1946  Age: 75 y.o. MRN: [id number]  CC:  Chief Complaint  Patient presents with   Insect Bite    X 1 day, Left forearm, inflamed, swollen, very painful    HPI [name] presents for bee sting.  Occurred yesterday.  She is on a ladder outdoors and saw a yellow jacket which stung her left forearm.  She did apply some ice afterwards.  She has had some itching and mild discomfort today.  She has had progressive swelling from initially distal forearm up to the proximal forearm.  None above the elbow region.  She has taken a total of 4 Benadryl yesterday (spread over several hours) without much improvement.  No fever.  No chills.  No dyspnea.  No generalized hives.  No history of bee sting allergy.  Past Medical History:  Diagnosis Date   Anxiety    Chicken pox    Chronic daily headache 10/28/2014   Colon polyp    Depression, recurrent ([place]) 10/28/2014   Hyperglycemia 01/19/2019   UTI (urinary tract infection)    Vitamin D deficiency 01/19/2019    Past Surgical History:  Procedure Laterality Date   AUGMENTATION MAMMAPLASTY     had impants removed and lift done   BREAST ENHANCEMENT SURGERY     BREAST IMPLANT REMOVAL     BUNIONECTOMY Right    EXCISION MORTON'S NEUROMA Right    TOTAL ABDOMINAL HYSTERECTOMY  2001   abnormal bleeding/spotting. hx of abnormal pap. released from further pap testing    Family History  Problem Relation Age of Onset   Other Mother        bowel obstruction   Healthy Father     Social History   Socioeconomic History   Marital status: Divorced    Spouse name: Not on file   Number of children: 2   Years of education: 12    Highest education level: High school graduate  Occupational History   Occupation: retired   Tobacco Use   Smoking status: Never   Smokeless tobacco: Never  Substance and Sexual Activity   Alcohol use: Yes     Alcohol/week: 0.0 standard drinks    Comment: occ; about 4 times per year    Drug use: No   Sexual activity: Not on file  Other Topics Concern   Not on file  Social History Narrative   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 (Patient not taking: Reported on 06/15/2021) 90 tablet 1   No facility-administered medications prior to visit.    Allergies  Allergen Reactions   Vicodin [Hydrocodone-Acetaminophen] Rash    ROS Review of Systems  Constitutional:  Negative for chills and fever.  Respiratory:  Negative for shortness of breath and wheezing.   Skin:  Positive for rash.     Objective:    Physical Exam Vitals reviewed.  Constitutional:      Appearance: Normal appearance.  Cardiovascular:     Rate and Rhythm: Normal rate and regular rhythm.  Skin:    Comments: He has diffuse swelling of the left forearm basically from the wrist all the way up toward the proximal forearm.  Especially involving the volar surface.  This is slightly warm to touch and erythematous.  Nontender.  Neurological:     Mental Status: She is alert.    BP 138/64 (BP Location: Left Arm, Patient Position: Sitting, Cuff Size: Normal)   Pulse (!) 106   Temp 98.2 F (36.8 C) (Oral)   Wt 142 lb 14.4 oz (64.8 kg)   SpO2 96%   BMI 23.78 kg/m  Wt Readings from Last 3 Encounters:   06/15/21 142 lb 14.4 oz (64.8 kg)  04/20/21 145 lb (65.8 kg)  01/10/21 148 lb 6.4 oz (67.3 kg)     Health Maintenance Due  Topic Date Due   Fecal DNA (Cologuard)  03/12/2020   Zoster Vaccines- Shingrix (2 of 2) 06/07/2020   COVID-19 Vaccine (3 - Pfizer risk series) 07/23/2020    There are no preventive care reminders to display for this patient.  Lab Results  Component Value Date   TSH 2.86 01/10/2021   Lab Results  Component Value Date   WBC 10.5 02/10/2021   HGB 12.8 02/10/2021   HCT 37.5 02/10/2021   MCV 92.2 02/10/2021   PLT 358.0 02/10/2021   Lab Results  Component Value Date   NA 135 02/10/2021   K 4.8 02/10/2021   CO2 28 02/10/2021   GLUCOSE 94 02/10/2021   BUN 10 02/10/2021   CREATININE 0.86 02/10/2021   BILITOT 0.5 01/10/2021   ALKPHOS 51 01/10/2021   AST 15 01/10/2021   ALT 18 01/10/2021   PROT 7.7 01/10/2021   ALBUMIN 4.2 01/10/2021   CALCIUM 9.4 02/10/2021   GFR 67.50 02/10/2021   Lab Results  Component Value Date   CHOL 234 (H) 01/10/2021   Lab Results  Component Value Date   HDL 53.30 01/10/2021   Lab Results  Component Value Date   LDLCALC 109 (H) 06/05/2019   Lab Results  Component Value Date   TRIG 261.0 (H) 01/10/2021   Lab Results  Component Value Date   CHOLHDL 4 01/10/2021   Lab Results  Component Value Date   HGBA1C 6.0 06/05/2019      Assessment & Plan:   Bee sting with reported yellowjacket left forearm.  She has some regional swelling involving the forearm.  No systemic symptoms.  -Continue frequent elevation and icing -Continue antihistamine.  She has insomnia with Benadryl we suggested Allegra or Zyrtec -We did discuss consideration for Depo-Medrol given the extent of her swelling and reaction and she wished to proceed with that.  Depo-Medrol 80 mg IM given. -Follow-up as needed for any persistent or worsening swelling.   Meds ordered this encounter  Medications   methylPREDNISolone acetate (DEPO-MEDROL)  injection 80 mg    Follow-up: No follow-ups on file.    Carolann Littler, MD

## 2021-06-15 NOTE — Patient Instructions (Addendum)
Consider over the counter anti-histamine such as Zyrtec, Allegra, or Claritan  Ice 15-20 minutes 3-4 times daily and continue to elevate

## 2021-06-23 ENCOUNTER — Encounter: Payer: Self-pay | Admitting: Family Medicine

## 2021-08-24 IMAGING — DX DG HIP (WITH OR WITHOUT PELVIS) 2-3V*L*
2 series · 2 of 2 positions shown · non-contrast
Comparison: None.

CLINICAL DATA: Left hip pain.

EXAM:
DG HIP (WITH OR WITHOUT PELVIS) 2-3V LEFT

[pelvis ap]
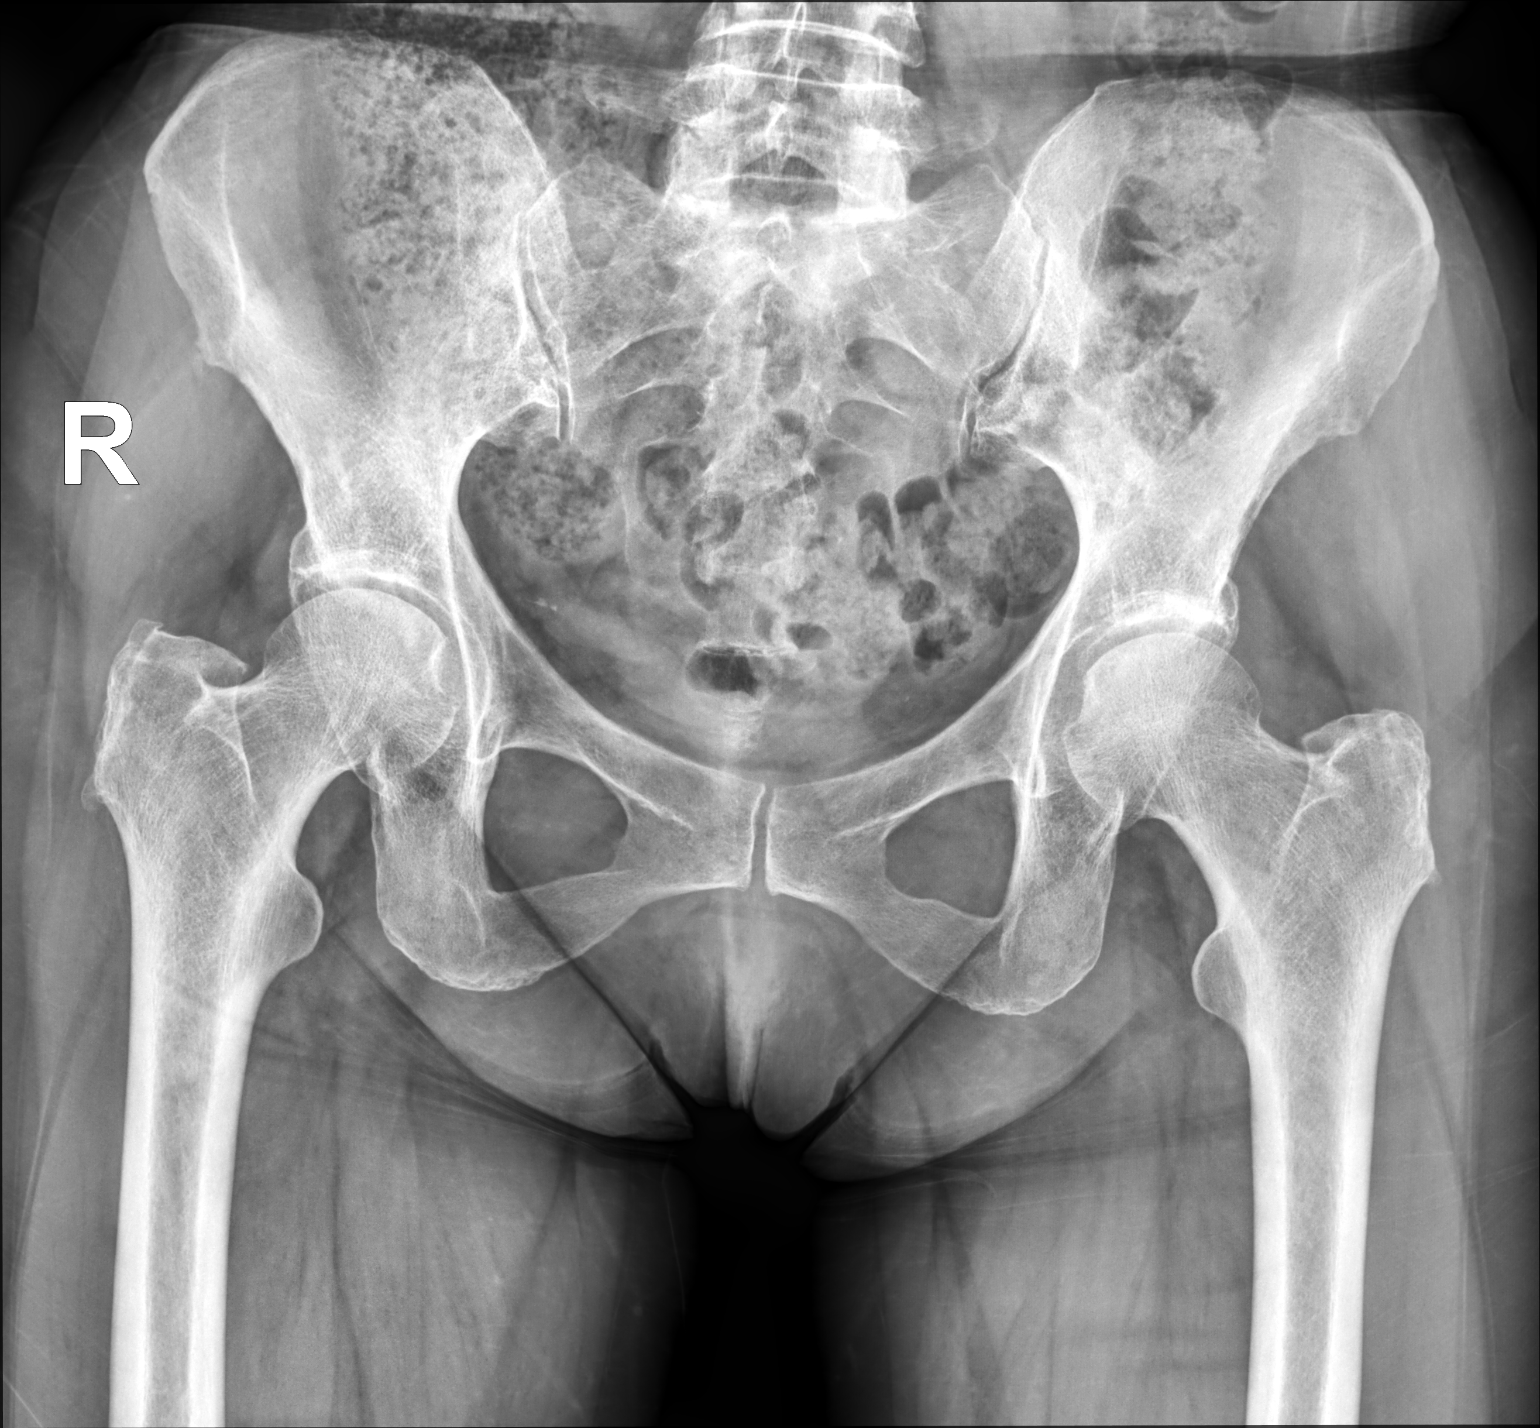

[hip (frog leg) lat]
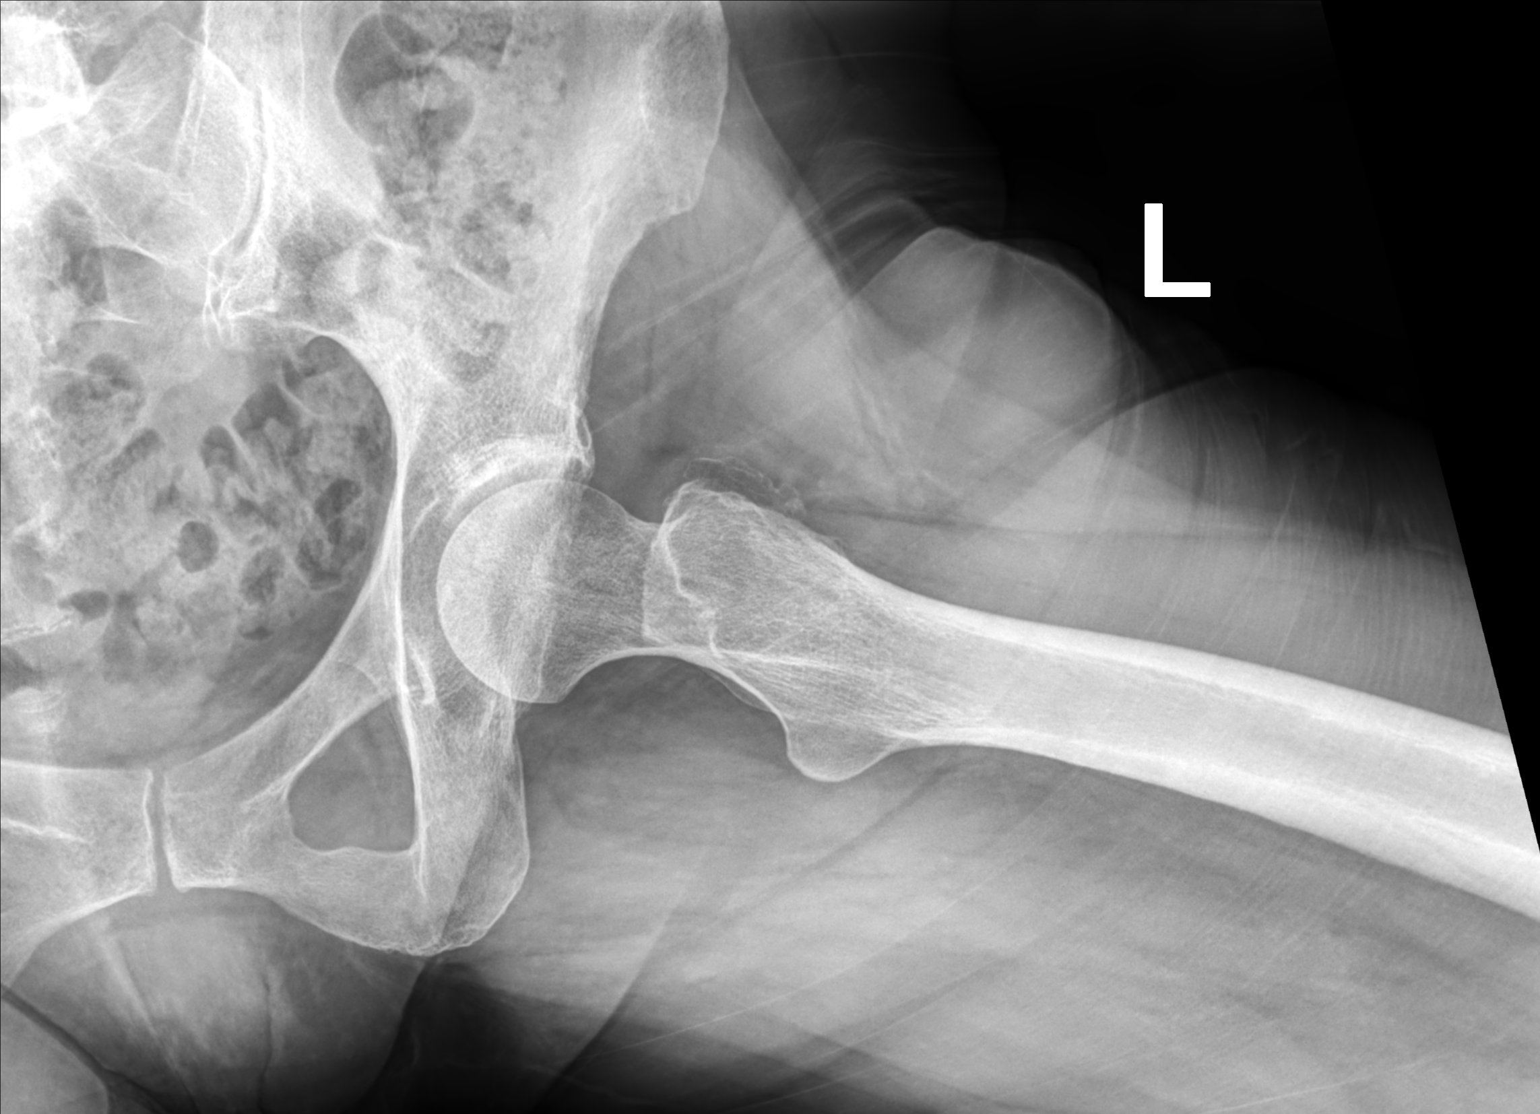

[2 of 2 positions shown; findings below may reference images not displayed]

FINDINGS: Normal anatomic alignment. No evidence for acute fracture or
dislocation. Mild left hip joint degenerative changes. SI joints are
unremarkable. Stool throughout the colon.
IMPRESSION: Mild left hip joint degenerative changes.

## 2021-09-06 ENCOUNTER — Ambulatory Visit (HOSPITAL_COMMUNITY)
Admission: EM | Admit: 2021-09-06 | Discharge: 2021-09-06 | Disposition: A | Discharge status: Home / Self Care | Payer: Medicare HMO | Attending: Emergency Medicine | Admitting: Emergency Medicine

## 2021-09-06 ENCOUNTER — Encounter (HOSPITAL_COMMUNITY): Payer: Self-pay | Admitting: Emergency Medicine

## 2021-09-06 ENCOUNTER — Other Ambulatory Visit: Payer: Self-pay

## 2021-09-06 DIAGNOSIS — T63441A Toxic effect of venom of bees, accidental (unintentional), initial encounter: Secondary | ICD-10-CM | POA: Diagnosis not present

## 2021-09-06 DIAGNOSIS — T7840XD Allergy, unspecified, subsequent encounter: Secondary | ICD-10-CM | POA: Diagnosis not present

## 2021-09-06 MED ORDER — METHYLPREDNISOLONE SODIUM SUCC 125 MG IJ SOLR
125.0000 mg | Freq: Once | INTRAMUSCULAR | Status: AC
  Administered 2021-09-06: 125 mg via INTRAMUSCULAR

## 2021-09-06 MED ORDER — EPINEPHRINE 0.3 MG/0.3ML IJ SOAJ: 0.3000 mg | INTRAMUSCULAR | 0 refills | Status: DC | PRN

## 2021-09-06 MED ORDER — METHYLPREDNISOLONE SODIUM SUCC 125 MG IJ SOLR
INTRAMUSCULAR | Status: AC
  Filled 2021-09-06: qty 2

## 2021-09-06 MED ORDER — PREDNISONE 10 MG (21) PO TBPK: ORAL_TABLET | Freq: Every day | ORAL | 0 refills | Status: DC

## 2021-09-06 NOTE — Discharge Instructions (Addendum)
We discussed going to er if symptoms become worse  You took benadryl at home already can take more in 6 hours as needed

## 2021-09-06 NOTE — ED Triage Notes (Signed)
Pt got stung on left hand around 1030am today. Reports took benadryl 50 mg about 45 minutes ago. Pt has left hand swelling

## 2021-09-06 NOTE — ED Notes (Signed)
Pt stated feeling she was going to pass out. Provided a cold rag and laid pt back. Pt LOC, called nurse to the room. Pt urinated. Stat at 97% HR initially at 117 bpm went down to 70 bpm after waking up. Medical Provider Morley Kos went in to evaluate.

## 2021-09-06 NOTE — ED Provider Notes (Addendum)
[place]    CSN: [id number] Arrival date & time: 09/06/21  1214      History   Chief Complaint Chief Complaint  Patient presents with   Insect Bite    HPI Destiny Robbins is a 75 y.o. female.   Pt here for a bee sting to lt hand at 1030 today. States this has happened a few months ago also. She believes that she has an allergy to them. Has swelling and redness to lt hand and long finger. Denies any sob, no chest pain. Pt took benadryl pta.    Past Medical History:  Diagnosis Date   Anxiety    Chicken pox    Chronic daily headache 10/28/2014   Colon polyp    Depression, recurrent ([place]) 10/28/2014   Hyperglycemia 01/19/2019   UTI (urinary tract infection)    Vitamin D deficiency 01/19/2019    Patient Active Problem List   Diagnosis Date Noted   Anxiety 01/19/2019   Hyperglycemia 01/19/2019   Vitamin D deficiency 01/19/2019   Chronic daily headache 10/28/2014   Depression, recurrent ([place]) 10/28/2014    Past Surgical History:  Procedure Laterality Date   AUGMENTATION MAMMAPLASTY     had impants removed and lift done   BREAST ENHANCEMENT SURGERY     BREAST IMPLANT REMOVAL     BUNIONECTOMY Right    EXCISION MORTON'S NEUROMA Right    TOTAL ABDOMINAL HYSTERECTOMY  2001   abnormal bleeding/spotting. hx of abnormal pap. released from further pap testing    OB History   No obstetric history on file.      Home Medications    Prior to Admission medications   Medication Sig Start Date End Date Taking? Authorizing Provider  EPINEPHrine 0.3 mg/0.3 mL IJ SOAJ injection Inject 0.3 mg into the muscle as needed for anaphylaxis. 09/06/21  Yes Marney Setting, NP  predniSONE (STERAPRED UNI-PAK 21 TAB) 10 MG (21) TBPK tablet Take by mouth daily. Take 6 tabs by mouth daily  for 2 days, then 5 tabs for 2 days, then 4 tabs for 2 days, then 3 tabs for 2 days, 2 tabs for 2 days, then 1 tab by mouth daily for 2 days 09/06/21  Yes Marney Setting, NP   estradiol (ESTRACE) 1 MG tablet Take 1 tablet (1 mg total) by mouth daily. Patient not taking: Reported on 06/15/2021 03/23/21   Caren Macadam, MD    Family History Family History  Problem Relation Age of Onset   Other Mother        bowel obstruction   Healthy Father     Social History Social History   Tobacco Use   Smoking status: Never   Smokeless tobacco: Never  Substance Use Topics   Alcohol use: Yes    Alcohol/week: 0.0 standard drinks    Comment: occ; about 4 times per year    Drug use: No     Allergies   Vicodin [hydrocodone-acetaminophen]   Review of Systems Review of Systems  HENT: Negative.    Eyes: Negative.   Respiratory: Negative.    Cardiovascular: Negative.   Musculoskeletal:  Positive for joint swelling.       Lt hand and long finger swelling   Skin:        Redness to lt hand   Neurological: Negative.     Physical Exam Triage Vital Signs ED Triage Vitals  Enc Vitals Group     BP 09/06/21 1326 (!) 151/79     Pulse  Rate 09/06/21 1326 69     Resp 09/06/21 1326 17     Temp 09/06/21 1326 97.9 F (36.6 C)     Temp Source 09/06/21 1326 Oral     SpO2 09/06/21 1326 100 %     Weight --      Height --      Head Circumference --      Peak Flow --      Pain Score 09/06/21 1325 5     Pain Loc --      Pain Edu? --      Excl. in [place]? --    No data found.  Updated Vital Signs BP (!) 151/79 (BP Location: Right Arm)   Pulse 69   Temp 97.9 F (36.6 C) (Oral)   Resp 17   SpO2 100%   Visual Acuity Right Eye Distance:   Left Eye Distance:   Bilateral Distance:    Right Eye Near:   Left Eye Near:    Bilateral Near:     Physical Exam Constitutional:      Appearance: Normal appearance.  HENT:     Nose: Nose normal.  Eyes:     Pupils: Pupils are equal, round, and reactive to light.  Cardiovascular:     Rate and Rhythm: Normal rate.  Pulmonary:     Effort: Pulmonary effort is normal.  Abdominal:     General: Abdomen is flat.   Musculoskeletal:     Cervical back: Normal range of motion.  Skin:    Capillary Refill: Capillary refill takes less than 2 seconds.     Findings: Erythema present.     Comments: Lt hand and long index digit, erythema, +1 edema noted. Area warm and pink. Decrease ROM due to swelling. Strong pulses.   Neurological:     General: No focal deficit present.     Mental Status: She is alert.     UC Treatments / Results  Labs (all labs ordered are listed, but only abnormal results are displayed) Labs Reviewed - No data to display  EKG   Radiology No results found.  Procedures Procedures (including critical care time)  Medications Ordered in UC Medications  methylPREDNISolone sodium succinate (SOLU-MEDROL) 125 mg/2 mL injection 125 mg (125 mg Intramuscular Given 09/06/21 1358)    Initial Impression / Assessment and Plan / UC Course  I have reviewed the triage vital signs and the nursing notes.  Pertinent labs & imaging results that were available during my care of the patient were reviewed by me and considered in my medical decision making (see chart for details).     Will need to go to er if you have increase swelling on the hand, any sob, chest pain or swelling to the throat.  Take meds as prescribed  Can take benadryl in 6 hours as needed  Pt had syncope after receiving the steroid injection she said that it began to burn and started to sweet and passed out. Nurse was with pt. She voided on self. Pt became alert after approx 30 sec. Did not fall nor hit her head. We called daughter in law and spoke to her. She is coming to get pt. Pt is alert x4 feels fine. Drinking po fluids. Monitored pt for 45 mins post .    Final Clinical Impressions(s) / UC Diagnoses   Final diagnoses:  Bee sting, accidental or unintentional, initial encounter  Allergic reaction, subsequent encounter     Discharge Instructions      We  discussed going to er if symptoms become worse  You took  benadryl at home already can take more in 6 hours as needed       ED Prescriptions     Medication Sig Dispense Auth. Provider   predniSONE (STERAPRED UNI-PAK 21 TAB) 10 MG (21) TBPK tablet Take by mouth daily. Take 6 tabs by mouth daily  for 2 days, then 5 tabs for 2 days, then 4 tabs for 2 days, then 3 tabs for 2 days, 2 tabs for 2 days, then 1 tab by mouth daily for 2 days 42 tablet Morley Kos L, NP   EPINEPHrine 0.3 mg/0.3 mL IJ SOAJ injection Inject 0.3 mg into the muscle as needed for anaphylaxis. 1 each Marney Setting, NP      PDMP not reviewed this encounter.   Marney Setting, NP 09/06/21 1404    Marney Setting, NP 09/06/21 1521

## 2021-09-07 ENCOUNTER — Ambulatory Visit: Payer: Medicare HMO | Admitting: Family Medicine

## 2021-11-27 DIAGNOSIS — H269 Unspecified cataract: Secondary | ICD-10-CM

## 2021-11-27 HISTORY — DX: Unspecified cataract: H26.9

## 2022-01-05 ENCOUNTER — Encounter: Payer: Self-pay | Admitting: Adult Health

## 2022-01-05 ENCOUNTER — Ambulatory Visit (INDEPENDENT_AMBULATORY_CARE_PROVIDER_SITE_OTHER): Payer: Medicare HMO | Admitting: Adult Health

## 2022-01-05 VITALS — BP 138/60 | HR 81 | Temp 98.0°F | Ht 65.0 in | Wt 140.0 lb

## 2022-01-05 DIAGNOSIS — H6123 Impacted cerumen, bilateral: Secondary | ICD-10-CM | POA: Diagnosis not present

## 2022-01-05 NOTE — Progress Notes (Signed)
Subjective:    Patient ID: Destiny Robbins, female    DOB: 05/29/1946, 76 y.o.   MRN: [id number]  HPI 76 year old female who  has a past medical history of Anxiety, Chicken pox, Chronic daily headache (10/28/2014), Colon polyp, Depression, recurrent ([place]) (10/28/2014), Hyperglycemia (01/19/2019), UTI (urinary tract infection), and Vitamin D deficiency (01/19/2019).  She presents to the office today with the complaint of " feeling like my ears are clogged".    Review of Systems See HPI   Past Medical History:  Diagnosis Date   Anxiety    Chicken pox    Chronic daily headache 10/28/2014   Colon polyp    Depression, recurrent ([place]) 10/28/2014   Hyperglycemia 01/19/2019   UTI (urinary tract infection)    Vitamin D deficiency 01/19/2019    Social History   Socioeconomic History   Marital status: Divorced    Spouse name: Not on file   Number of children: 2   Years of education: 12    Highest education level: High school graduate  Occupational History   Occupation: retired   Tobacco Use   Smoking status: Never   Smokeless tobacco: Never  Substance and Sexual Activity   Alcohol use: Yes    Alcohol/week: 0.0 Robbins drinks    Comment: occ; about 4 times per year    Drug use: No   Sexual activity: Not on file  Other Topics Concern   Not on file  Social History Narrative   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      Spiritual Beliefs: Non-denominational, christian      Lifestyle: starting to exercise; healthy diet      HCPOA: daughter Dawne Casali [phone]; has living will             Social Determinants of Health   Financial Resource Strain: Not on file  Food Insecurity: Not on file  Transportation Needs: Not on file  Physical Activity: Not on file  Stress: Not on file  Social Connections: Not on file  Intimate Partner Violence: Not on file    Past Surgical History:  Procedure Laterality Date   AUGMENTATION MAMMAPLASTY      had impants removed and lift done   BREAST ENHANCEMENT SURGERY     BREAST IMPLANT REMOVAL     BUNIONECTOMY Right    EXCISION MORTON'S NEUROMA Right    TOTAL ABDOMINAL HYSTERECTOMY  2001   abnormal bleeding/spotting. hx of abnormal pap. released from further pap testing    Family History  Problem Relation Age of Onset   Other Mother        bowel obstruction   Healthy Father     Allergies  Allergen Reactions   Vicodin [Hydrocodone-Acetaminophen] Rash    Current Outpatient Medications on File Prior to Visit  Medication Sig Dispense Refill   EPINEPHrine 0.3 mg/0.3 mL IJ SOAJ injection Inject 0.3 mg into the muscle as needed for anaphylaxis. 1 each 0   No current facility-administered medications on file prior to visit.    BP 138/60    Pulse 81    Temp 98 F (36.7 C) (Oral)    Ht 5\' 5"  (1.651 m)    Wt 140 lb (63.5 kg)    SpO2 98%    BMI 23.30 kg/m       Objective:   Physical Exam Vitals and nursing note reviewed.  Constitutional:      Appearance: Normal appearance.  HENT:  Right Ear: There is no impacted cerumen.     Left Ear: There is no impacted cerumen.  Skin:    General: Skin is warm and dry.     Capillary Refill: Capillary refill takes less than 2 seconds.  Neurological:     General: No focal deficit present.     Mental Status: She is alert and oriented to person, place, and time.  Psychiatric:        Mood and Affect: Mood normal.        Behavior: Behavior normal.        Thought Content: Thought content normal.        Judgment: Judgment normal.       Assessment & Plan:  1. Bilateral impacted cerumen Verbal Consent obtained. Warm water was applied and gentle ear lavage performed  bilaterally. There were no complications and following the disimpaction the tympanic membrane were visible bilaterally. Tympanic membranes are intact following the procedure.  Auditory canals are normal.  The patient reported relief of symptoms after removal of cerumen. Patient  tolerated procedure well.   [name], NP

## 2022-01-27 ENCOUNTER — Encounter: Payer: Self-pay | Admitting: Family Medicine

## 2022-02-01 ENCOUNTER — Other Ambulatory Visit: Payer: Self-pay | Admitting: Family Medicine

## 2022-02-01 DIAGNOSIS — Z1231 Encounter for screening mammogram for malignant neoplasm of breast: Secondary | ICD-10-CM

## 2022-02-04 DIAGNOSIS — Z1211 Encounter for screening for malignant neoplasm of colon: Secondary | ICD-10-CM | POA: Diagnosis not present

## 2022-02-08 ENCOUNTER — Ambulatory Visit
Admission: RE | Admit: 2022-02-08 | Discharge: 2022-02-08 | Disposition: A | Discharge status: Home / Self Care | Payer: Medicare HMO | Source: Ambulatory Visit | Attending: Family Medicine | Admitting: Family Medicine

## 2022-02-08 DIAGNOSIS — Z1231 Encounter for screening mammogram for malignant neoplasm of breast: Secondary | ICD-10-CM

## 2022-02-11 LAB — COLOGUARD: COLOGUARD: NEGATIVE

## 2022-03-07 DIAGNOSIS — D23111 Other benign neoplasm of skin of right upper eyelid, including canthus: Secondary | ICD-10-CM | POA: Diagnosis not present

## 2022-03-07 DIAGNOSIS — H2513 Age-related nuclear cataract, bilateral: Secondary | ICD-10-CM | POA: Diagnosis not present

## 2022-03-14 ENCOUNTER — Other Ambulatory Visit: Payer: Self-pay | Admitting: *Deleted

## 2022-03-14 DIAGNOSIS — Z1211 Encounter for screening for malignant neoplasm of colon: Secondary | ICD-10-CM

## 2022-03-14 NOTE — Progress Notes (Signed)
New order entered due to fax from [employer] stating the test was not covered with previous submitted diagnosis.   ?

## 2022-04-20 DIAGNOSIS — D485 Neoplasm of uncertain behavior of skin: Secondary | ICD-10-CM | POA: Diagnosis not present

## 2022-04-20 DIAGNOSIS — L820 Inflamed seborrheic keratosis: Secondary | ICD-10-CM | POA: Diagnosis not present

## 2022-05-11 DIAGNOSIS — H2512 Age-related nuclear cataract, left eye: Secondary | ICD-10-CM | POA: Diagnosis not present

## 2022-05-11 DIAGNOSIS — H35372 Puckering of macula, left eye: Secondary | ICD-10-CM | POA: Diagnosis not present

## 2022-05-11 DIAGNOSIS — H25043 Posterior subcapsular polar age-related cataract, bilateral: Secondary | ICD-10-CM | POA: Diagnosis not present

## 2022-05-11 DIAGNOSIS — H2513 Age-related nuclear cataract, bilateral: Secondary | ICD-10-CM | POA: Diagnosis not present

## 2022-05-11 DIAGNOSIS — H25013 Cortical age-related cataract, bilateral: Secondary | ICD-10-CM | POA: Diagnosis not present

## 2022-05-11 DIAGNOSIS — H18413 Arcus senilis, bilateral: Secondary | ICD-10-CM | POA: Diagnosis not present

## 2022-06-13 DIAGNOSIS — D485 Neoplasm of uncertain behavior of skin: Secondary | ICD-10-CM | POA: Diagnosis not present

## 2022-06-13 DIAGNOSIS — L820 Inflamed seborrheic keratosis: Secondary | ICD-10-CM | POA: Diagnosis not present

## 2022-07-13 DIAGNOSIS — Z0100 Encounter for examination of eyes and vision without abnormal findings: Secondary | ICD-10-CM | POA: Diagnosis not present

## 2022-08-13 IMAGING — MG DIGITAL SCREENING BILAT W/ CAD
4 series · 4 of 4 positions shown · non-contrast
Comparison: Previous exam(s).

CLINICAL DATA: Screening.

EXAM:
DIGITAL SCREENING BILATERAL MAMMOGRAM WITH CAD
TECHNIQUE: Bilateral screening digital craniocaudal and mediolateral oblique
mammograms were obtained. The images were evaluated with
computer-aided detection.

[L CC]
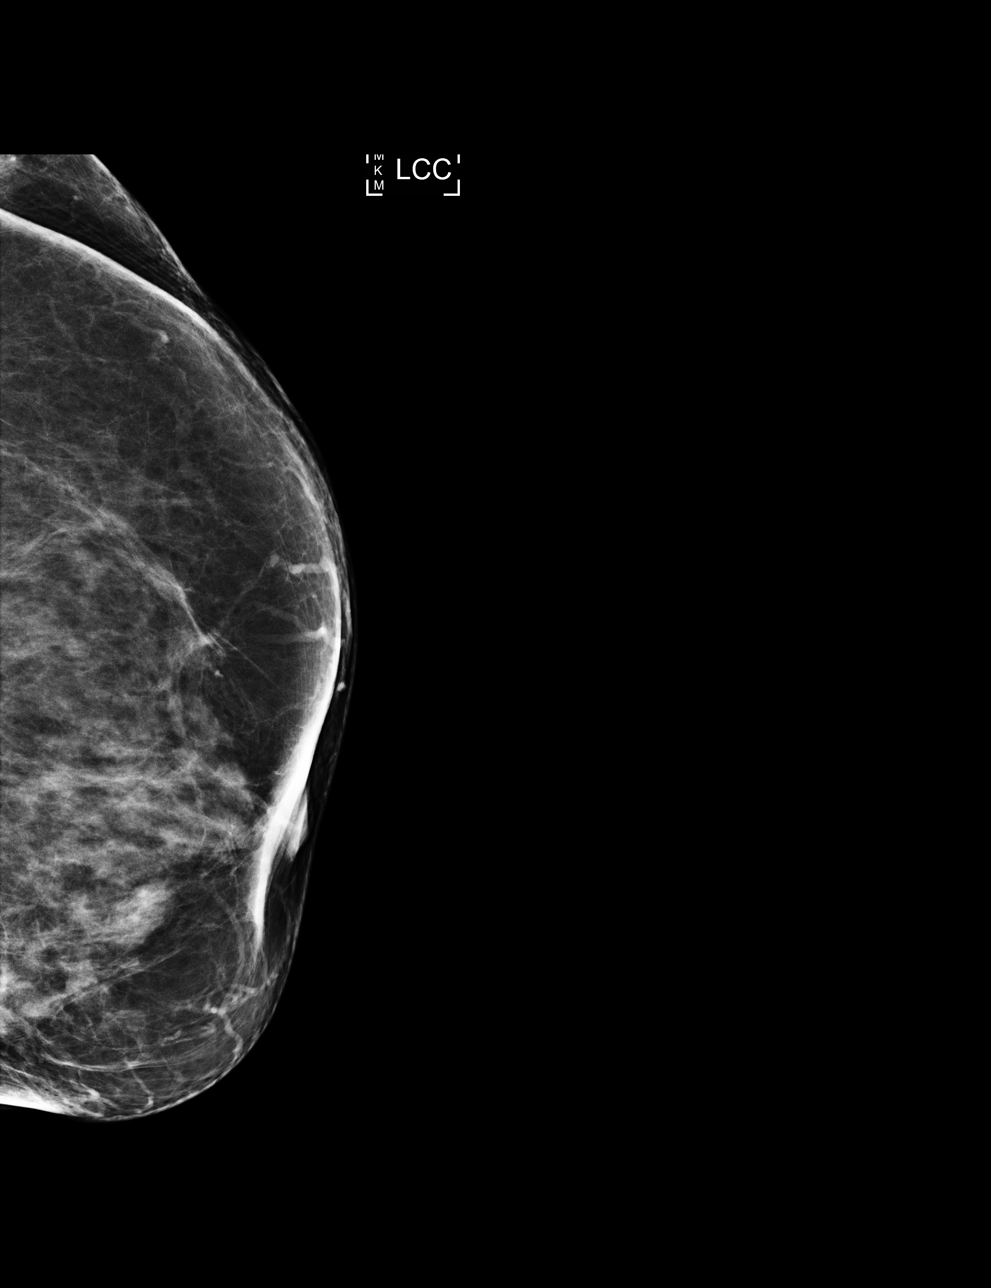

[R CC]
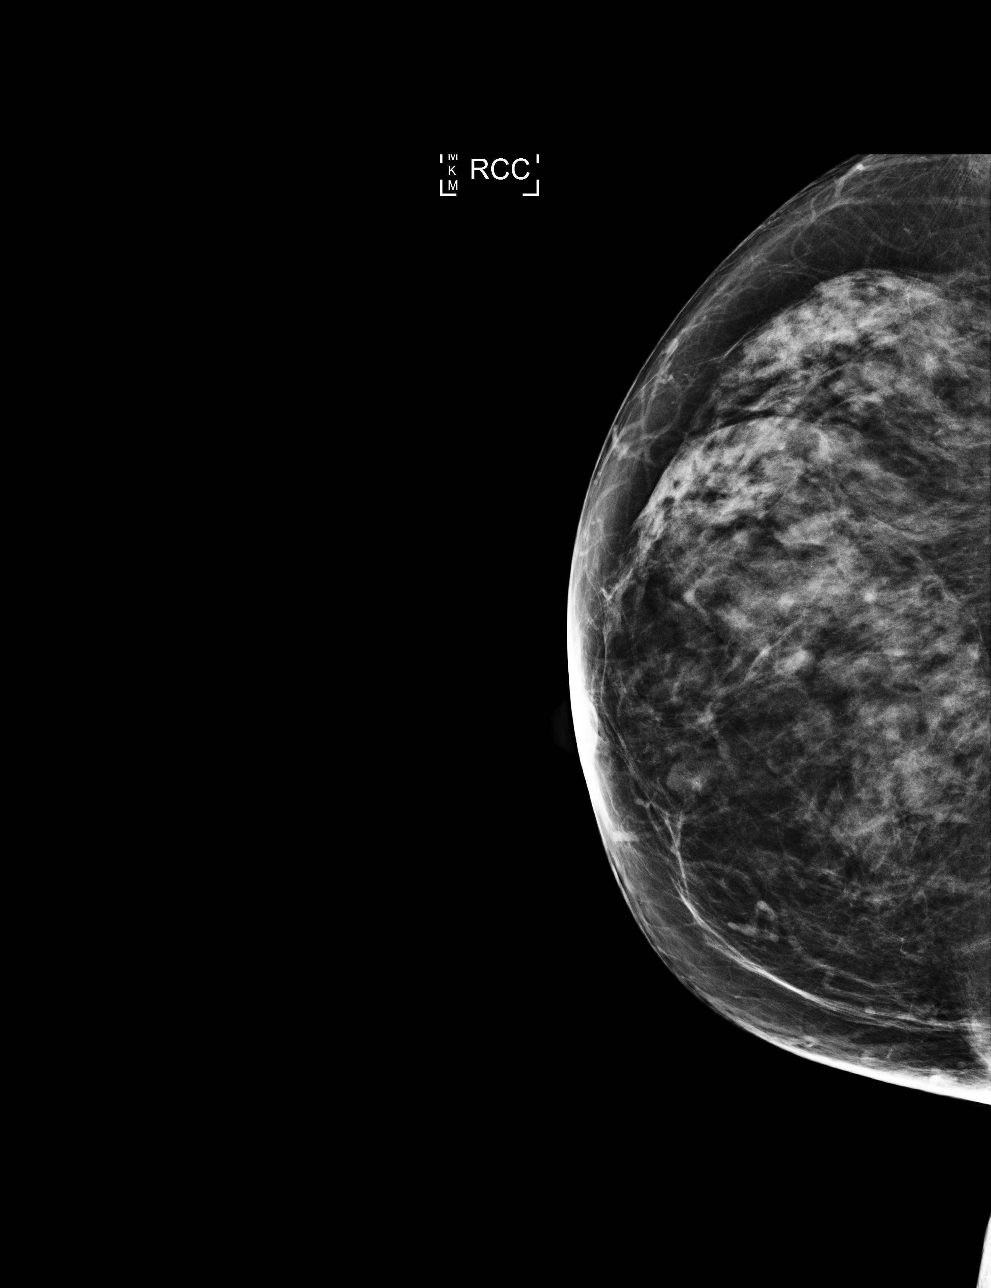

[R MLO]
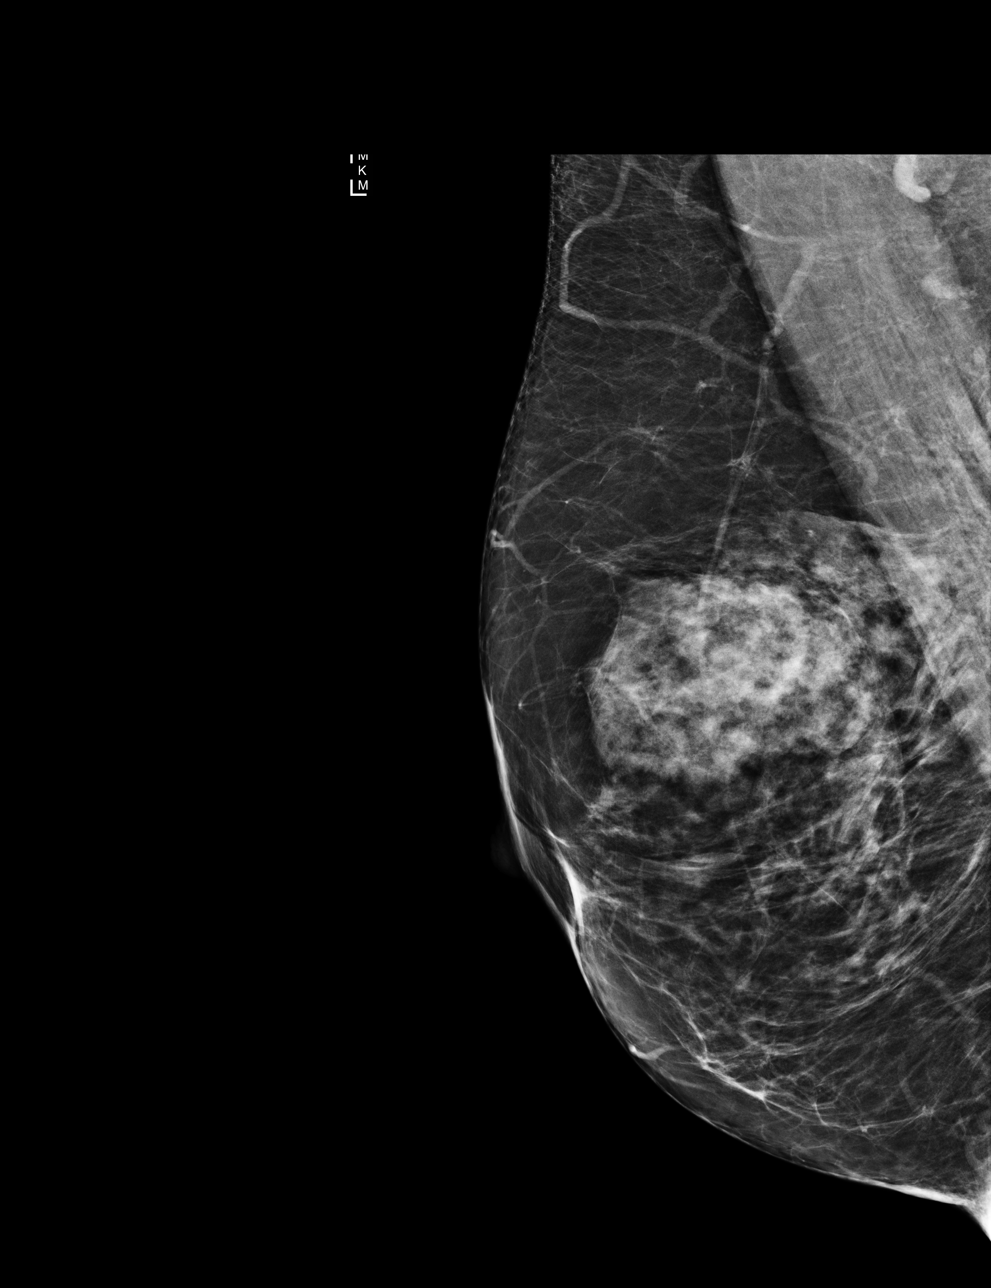

[L MLO]
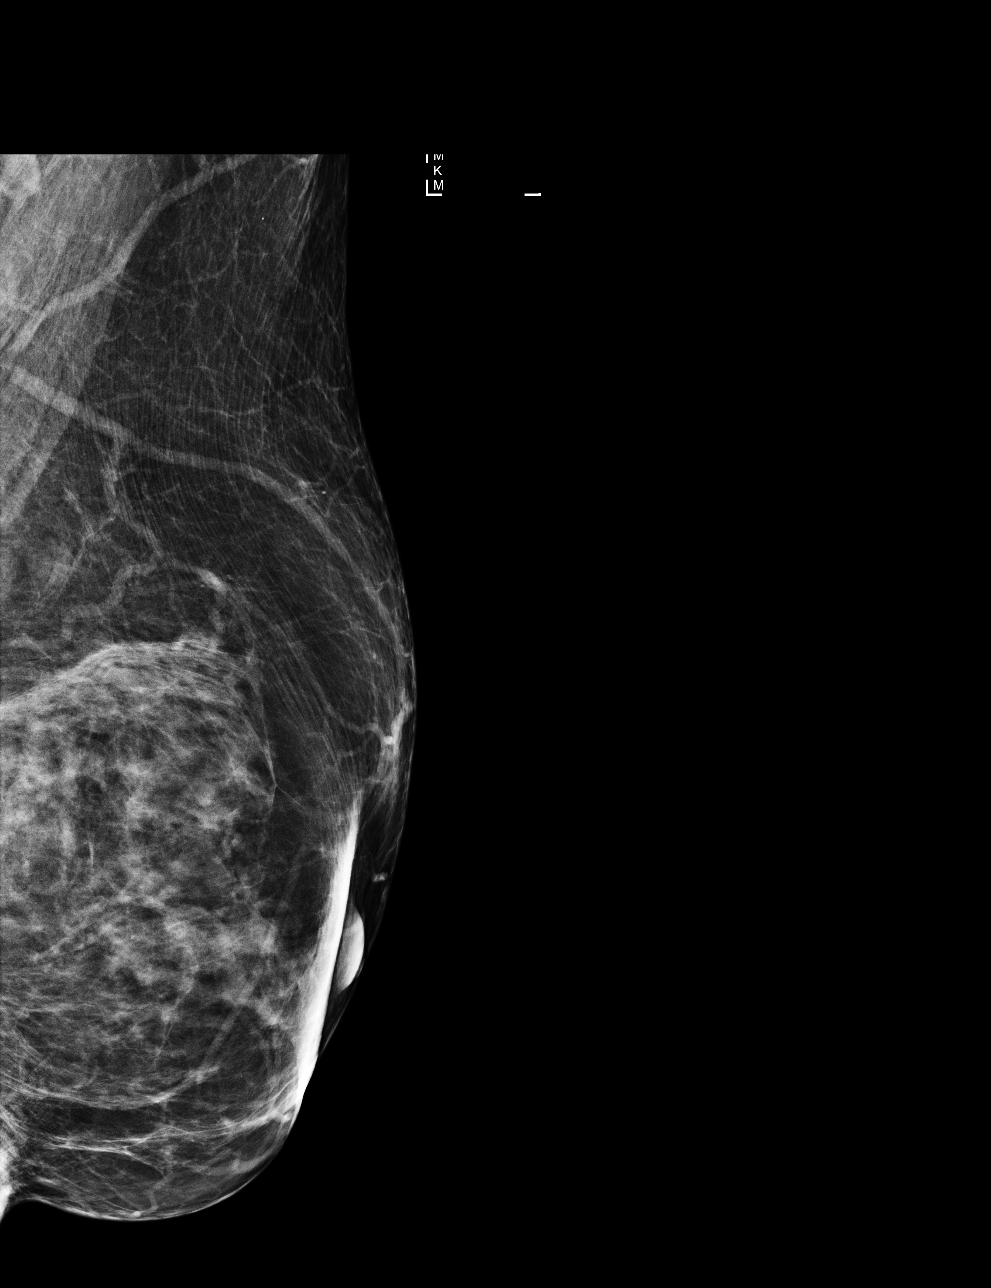

[4 of 4 positions shown; findings below may reference images not displayed]

ACR Breast Density Category c: The breast tissue is heterogeneously
dense, which may obscure small masses.
FINDINGS: There are no findings suspicious for malignancy.
IMPRESSION: No mammographic evidence of malignancy. A result letter of this
screening mammogram will be mailed directly to the patient.

RECOMMENDATION:
Screening mammogram in one year. (Code:TJ-B-ZPA)

BI-RADS CATEGORY  1: Negative.

## 2022-10-05 ENCOUNTER — Encounter (HOSPITAL_COMMUNITY): Payer: Self-pay

## 2022-10-05 ENCOUNTER — Ambulatory Visit (HOSPITAL_COMMUNITY)
Admission: EM | Admit: 2022-10-05 | Discharge: 2022-10-05 | Disposition: A | Discharge status: Home / Self Care | Payer: Medicare HMO | Attending: Emergency Medicine | Admitting: Emergency Medicine

## 2022-10-05 DIAGNOSIS — J069 Acute upper respiratory infection, unspecified: Secondary | ICD-10-CM | POA: Diagnosis not present

## 2022-10-05 MED ORDER — BENZONATATE 100 MG PO CAPS: 100.0000 mg | ORAL_CAPSULE | Freq: Three times a day (TID) | ORAL | 0 refills | Status: AC | PRN

## 2022-10-05 NOTE — ED Notes (Signed)
Discharged by [facility], CMA.

## 2022-10-05 NOTE — ED Triage Notes (Signed)
Patient having ear fullness on the left side. Onset 2 weeks ago.   Patient having cough and headache. Onset Friday. Dry cough. No nasal congestion/drainage, fever, chills, or body aches.   Patient states has been around her 29 and 52 yr olds and they were sick, baby granddaughter had an ear infection.

## 2022-10-05 NOTE — Discharge Instructions (Signed)
I recommend daily Zyrtec to help with congestion, cough, ear fullness.  You can take the Tessalon, cough medicine, every 6 hours as needed (3 times daily)  Drink lots of fluids  Please return or follow-up with your primary care provider if symptoms persist

## 2022-10-05 NOTE — ED Provider Notes (Signed)
[place]    CSN: [id number] Arrival date & time: 10/05/22  0800      History   Chief Complaint Chief Complaint  Patient presents with   Cough   Ear Fullness    HPI Destiny Robbins is a 76 y.o. female.  Presents with a 6-day history of cough Dry, random throughout the day No shortness of breath Some nasal congestion, ear fullness, postnasal drip  Denies fevers  Grandchildren were sick recently  Has not tried any medications  Past Medical History:  Diagnosis Date   Anxiety    Chicken pox    Chronic daily headache 10/28/2014   Colon polyp    Depression, recurrent ([place]) 10/28/2014   Hyperglycemia 01/19/2019   UTI (urinary tract infection)    Vitamin D deficiency 01/19/2019    Patient Active Problem List   Diagnosis Date Noted   Anxiety 01/19/2019   Hyperglycemia 01/19/2019   Vitamin D deficiency 01/19/2019   Chronic daily headache 10/28/2014   Depression, recurrent ([place]) 10/28/2014    Past Surgical History:  Procedure Laterality Date   AUGMENTATION MAMMAPLASTY     had impants removed and lift done   BREAST ENHANCEMENT SURGERY     BREAST IMPLANT REMOVAL     BUNIONECTOMY Right    EXCISION MORTON'S NEUROMA Right    TOTAL ABDOMINAL HYSTERECTOMY  2001   abnormal bleeding/spotting. hx of abnormal pap. released from further pap testing    OB History   No obstetric history on file.      Home Medications    Prior to Admission medications   Medication Sig Start Date End Date Taking? Authorizing Provider  benzonatate (TESSALON) 100 MG capsule Take 1 capsule (100 mg total) by mouth 3 (three) times daily as needed for up to 5 days for cough. 10/05/22 10/10/22 Yes Cobey Raineri, Wells Guiles, PA-C  EPINEPHrine 0.3 mg/0.3 mL IJ SOAJ injection Inject 0.3 mg into the muscle as needed for anaphylaxis. 09/06/21   Marney Setting, NP    Family History Family History  Problem Relation Age of Onset   Other Mother        bowel obstruction   Healthy Father      Social History Social History   Tobacco Use   Smoking status: Never   Smokeless tobacco: Never  Substance Use Topics   Alcohol use: Yes    Alcohol/week: 0.0 standard drinks of alcohol    Comment: occ; about 4 times per year    Drug use: No     Allergies   Vicodin [hydrocodone-acetaminophen]   Review of Systems Review of Systems  Respiratory:  Positive for cough.    Per HPI  Physical Exam Triage Vital Signs ED Triage Vitals [10/05/22 0819]  Enc Vitals Group     BP (!) 158/83     Pulse Rate 90     Resp 16     Temp 97.9 F (36.6 C)     Temp Source Oral     SpO2 97 %     Weight      Height      Head Circumference      Peak Flow      Pain Score 3     Pain Loc      Pain Edu?      Excl. in [place]?    No data found.  Updated Vital Signs BP (!) 158/83 (BP Location: Right Arm)   Pulse 90   Temp 97.9 F (36.6 C) (Oral)   Resp  16   SpO2 97%    Physical Exam Vitals and nursing note reviewed.  Constitutional:      General: She is not in acute distress. HENT:     Right Ear: Tympanic membrane and ear canal normal.     Left Ear: Tympanic membrane and ear canal normal.     Nose: Nose normal.     Mouth/Throat:     Mouth: Mucous membranes are moist.     Pharynx: Oropharynx is clear. No posterior oropharyngeal erythema.     Comments: Postnasal drip Cardiovascular:     Rate and Rhythm: Normal rate and regular rhythm.     Pulses: Normal pulses.     Heart sounds: Normal heart sounds.  Pulmonary:     Effort: Pulmonary effort is normal. No respiratory distress.     Breath sounds: Normal breath sounds. No wheezing.     Comments: Clear lungs throughout Skin:    General: Skin is warm and dry.  Neurological:     Mental Status: She is alert and oriented to person, place, and time.      UC Treatments / Results  Labs (all labs ordered are listed, but only abnormal results are displayed) Labs Reviewed - No data to display  EKG   Radiology No results  found.  Procedures Procedures (including critical care time)  Medications Ordered in UC Medications - No data to display  Initial Impression / Assessment and Plan / UC Course  I have reviewed the triage vital signs and the nursing notes.  Pertinent labs & imaging results that were available during my care of the patient were reviewed by me and considered in my medical decision making (see chart for details).  Well-appearing, afebrile, cough is dry in clinic, clear lungs Likely viral, could have allergies as well Discussed using Zyrtec 10 mg daily Tessalon 3 times daily as needed for cough Trying other symptomatic care at home, giving it some time to clear.  Discussed return precautions including worsening symptoms.  Patient agrees to plan  Final Clinical Impressions(s) / UC Diagnoses   Final diagnoses:  Viral URI with cough     Discharge Instructions      I recommend daily Zyrtec to help with congestion, cough, ear fullness.  You can take the Tessalon, cough medicine, every 6 hours as needed (3 times daily)  Drink lots of fluids  Please return or follow-up with your primary care provider if symptoms persist    ED Prescriptions     Medication Sig Dispense Auth. Provider   benzonatate (TESSALON) 100 MG capsule Take 1 capsule (100 mg total) by mouth 3 (three) times daily as needed for up to 5 days for cough. 15 capsule Fawnda Vitullo, Wells Guiles, PA-C      PDMP not reviewed this encounter.   Achille Xiang, Wells Guiles, [place] 10/05/22 [phone]

## 2022-10-09 DIAGNOSIS — H2511 Age-related nuclear cataract, right eye: Secondary | ICD-10-CM | POA: Diagnosis not present

## 2022-10-10 DIAGNOSIS — H2512 Age-related nuclear cataract, left eye: Secondary | ICD-10-CM | POA: Diagnosis not present

## 2022-10-10 DIAGNOSIS — H25042 Posterior subcapsular polar age-related cataract, left eye: Secondary | ICD-10-CM | POA: Diagnosis not present

## 2022-10-10 DIAGNOSIS — H25012 Cortical age-related cataract, left eye: Secondary | ICD-10-CM | POA: Diagnosis not present

## 2022-10-12 ENCOUNTER — Encounter (HOSPITAL_COMMUNITY): Payer: Self-pay

## 2022-10-12 ENCOUNTER — Ambulatory Visit (HOSPITAL_COMMUNITY)
Admission: EM | Admit: 2022-10-12 | Discharge: 2022-10-12 | Disposition: A | Discharge status: Home / Self Care | Payer: Medicare HMO | Attending: Emergency Medicine | Admitting: Emergency Medicine

## 2022-10-12 ENCOUNTER — Ambulatory Visit (INDEPENDENT_AMBULATORY_CARE_PROVIDER_SITE_OTHER): Payer: Medicare HMO

## 2022-10-12 DIAGNOSIS — J309 Allergic rhinitis, unspecified: Secondary | ICD-10-CM

## 2022-10-12 DIAGNOSIS — R0989 Other specified symptoms and signs involving the circulatory and respiratory systems: Secondary | ICD-10-CM

## 2022-10-12 DIAGNOSIS — R030 Elevated blood-pressure reading, without diagnosis of hypertension: Secondary | ICD-10-CM | POA: Diagnosis not present

## 2022-10-12 DIAGNOSIS — J449 Chronic obstructive pulmonary disease, unspecified: Secondary | ICD-10-CM | POA: Diagnosis not present

## 2022-10-12 DIAGNOSIS — J439 Emphysema, unspecified: Secondary | ICD-10-CM

## 2022-10-12 MED ORDER — MOMETASONE FUROATE 50 MCG/ACT NA SUSP: 2.0000 | Freq: Every day | NASAL | 2 refills | Status: DC

## 2022-10-12 MED ORDER — FEXOFENADINE HCL 60 MG PO TABS: 60.0000 mg | ORAL_TABLET | Freq: Two times a day (BID) | ORAL | 2 refills | Status: DC

## 2022-10-12 NOTE — ED Provider Notes (Signed)
[place]    CSN: [id number] Arrival date & time: 10/12/22  1359    HISTORY   Chief Complaint  Patient presents with   Cough   HPI Destiny Robbins is a pleasant, 76 y.o. female who presents to urgent care today. Patient was seen 7 days ago complaining of a 6-day history of intermittent dry cough, mild nasal congestion, sensation of fullness in the ears and postnasal drip.  At the time, patient denied history of fever.  Patient was advised she likely had a viral infection as well as underlying environmental allergies.  Patient was advised to begin Zyrtec and provided with Tessalon pearls 3 times daily.  Return precautions were advised.  Patient complains cough has not improved.  Initially, patient stated the cough was not any worse but states the cough is now occasionally productive of sputum.  Patient states she is not aware of the color or quality of his sputum.  Patient denies shortness of breath with cough but feels that her cough is more rough than it was before.  Patient states she has had some relief with Tessalon Perles.  Patient states she is also tried a single dose of Mucinex and a single dose of Zyrtec both of which were ineffective in relieving her cough. Patient denies history of seasonal allergies, asthma, states both of her grandsons go to daycare and both of them have had a cough, and is not known to [place] at home has been sick.Blood pressure is significantly elevated on arrival today, higher than it was 7 days ago which was also significantly elevated.  Patient denies history of diagnosis of hypertension and adds that she has never been treated for hypertension.     The history is provided by the patient.     Past Medical History:  Diagnosis Date   Anxiety    Chicken pox    Chronic daily headache 10/28/2014   Colon polyp    Depression, recurrent ([place]) 10/28/2014   Hyperglycemia 01/19/2019   UTI (urinary tract infection)    Vitamin D deficiency  01/19/2019   Patient Active Problem List   Diagnosis Date Noted   Anxiety 01/19/2019   Hyperglycemia 01/19/2019   Vitamin D deficiency 01/19/2019   Chronic daily headache 10/28/2014   Depression, recurrent ([place]) 10/28/2014   Past Surgical History:  Procedure Laterality Date   AUGMENTATION MAMMAPLASTY     had impants removed and lift done   BREAST ENHANCEMENT SURGERY     BREAST IMPLANT REMOVAL     BUNIONECTOMY Right    EXCISION MORTON'S NEUROMA Right    TOTAL ABDOMINAL HYSTERECTOMY  2001   abnormal bleeding/spotting. hx of abnormal pap. released from further pap testing   OB History   No obstetric history on file.    Home Medications    Prior to Admission medications   Medication Sig Start Date End Date Taking? Authorizing Provider  EPINEPHrine 0.3 mg/0.3 mL IJ SOAJ injection Inject 0.3 mg into the muscle as needed for anaphylaxis. 09/06/21   Marney Setting, NP    Family History Family History  Problem Relation Age of Onset   Other Mother        bowel obstruction   Healthy Father    Social History Social History   Tobacco Use   Smoking status: Never   Smokeless tobacco: Never  Substance Use Topics   Alcohol use: Yes    Alcohol/week: 0.0 standard drinks of alcohol    Comment: occ; about 4 times per year  Drug use: No   Allergies   Vicodin [hydrocodone-acetaminophen]  Review of Systems Review of Systems Pertinent findings revealed after performing a 14 point review of systems has been noted in the history of present illness.  Physical Exam Triage Vital Signs ED Triage Vitals  Enc Vitals Group     BP 09/23/21 0827 (!) 147/82     Pulse Rate 09/23/21 0827 72     Resp 09/23/21 0827 18     Temp 09/23/21 0827 98.3 F (36.8 C)     Temp Source 09/23/21 0827 Oral     SpO2 09/23/21 0827 98 %     Weight --      Height --      Head Circumference --      Peak Flow --      Pain Score 09/23/21 0826 5     Pain Loc --      Pain Edu? --      Excl. in [place]?  --   No data found.  Updated Vital Signs BP (!) 173/82 (BP Location: Right Arm)   Pulse 71   Temp 98.2 F (36.8 C) (Oral)   Resp 16   SpO2 99%   Physical Exam Vitals and nursing note reviewed.  Constitutional:      General: She is not in acute distress.    Appearance: Normal appearance. She is not ill-appearing.  HENT:     Head: Normocephalic and atraumatic.     Salivary Glands: Right salivary gland is not diffusely enlarged or tender. Left salivary gland is not diffusely enlarged or tender.     Right Ear: Ear canal and external ear normal. No drainage. A middle ear effusion is present. There is no impacted cerumen. Tympanic membrane is bulging. Tympanic membrane is not injected or erythematous.     Left Ear: Ear canal and external ear normal. No drainage. A middle ear effusion is present. There is no impacted cerumen. Tympanic membrane is bulging. Tympanic membrane is not injected or erythematous.     Ears:     Comments: Bilateral EACs normal, both TMs bulging with clear fluid    Nose: Rhinorrhea present. No nasal deformity, septal deviation, signs of injury, nasal tenderness, mucosal edema or congestion. Rhinorrhea is clear.     Right Nostril: Occlusion present. No foreign body, epistaxis or septal hematoma.     Left Nostril: Occlusion present. No foreign body, epistaxis or septal hematoma.     Right Turbinates: Enlarged, swollen and pale.     Left Turbinates: Enlarged, swollen and pale.     Right Sinus: No maxillary sinus tenderness or frontal sinus tenderness.     Left Sinus: No maxillary sinus tenderness or frontal sinus tenderness.     Mouth/Throat:     Lips: Pink. No lesions.     Mouth: Mucous membranes are moist. No oral lesions.     Pharynx: Oropharynx is clear. Uvula midline. No posterior oropharyngeal erythema or uvula swelling.     Tonsils: No tonsillar exudate. 0 on the right. 0 on the left.     Comments: Postnasal drip Eyes:     General: Lids are normal.         Right eye: No discharge.        Left eye: No discharge.     Extraocular Movements: Extraocular movements intact.     Conjunctiva/sclera: Conjunctivae normal.     Right eye: Right conjunctiva is not injected.     Left eye: Left conjunctiva is not injected.  Neck:     Trachea: Trachea and phonation normal.  Cardiovascular:     Rate and Rhythm: Normal rate and regular rhythm.     Pulses: Normal pulses.     Heart sounds: Normal heart sounds. No murmur heard.    No friction rub. No gallop.  Pulmonary:     Effort: Pulmonary effort is normal. No accessory muscle usage, prolonged expiration or respiratory distress.     Breath sounds: Normal breath sounds. No stridor, decreased air movement or transmitted upper airway sounds. No decreased breath sounds, wheezing, rhonchi or rales.  Chest:     Chest wall: No tenderness.  Musculoskeletal:        General: Normal range of motion.     Cervical back: Normal range of motion and neck supple. Normal range of motion.  Lymphadenopathy:     Cervical: No cervical adenopathy.  Skin:    General: Skin is warm and dry.     Findings: No erythema or rash.  Neurological:     General: No focal deficit present.     Mental Status: She is alert and oriented to person, place, and time.  Psychiatric:        Mood and Affect: Mood normal.        Behavior: Behavior normal.     Visual Acuity Right Eye Distance:   Left Eye Distance:   Bilateral Distance:    Right Eye Near:   Left Eye Near:    Bilateral Near:     UC Couse / Diagnostics / Procedures:     Radiology DG Chest 2 View  Result Date: 10/12/2022 CLINICAL DATA:  Rales and rhonchi in right upper lung fields EXAM: CHEST - 2 VIEW COMPARISON:  None Available. FINDINGS: Cardiac size is within normal limits. Increase in AP diameter of chest suggests COPD. There are no signs of pulmonary edema or focal pulmonary consolidation. There is no pleural effusion or pneumothorax. There is subtle increase in  interstitial markings in both apices suggesting possible scarring. IMPRESSION: COPD. Possible minimal scarring is seen in both apices. There are no signs of pulmonary edema or focal pulmonary consolidation. There is no pleural effusion. Electronically Signed   By: Elmer Picker M.D.   On: 10/12/2022 16:18    Procedures Procedures (including critical care time) EKG  Pending results:  Labs Reviewed - No data to display  Medications Ordered in UC: Medications - No data to display  UC Diagnoses / Final Clinical Impressions(s)   I have reviewed the triage vital signs and the nursing notes.  Pertinent labs & imaging results that were available during my care of the patient were reviewed by me and considered in my medical decision making (see chart for details).    Final diagnoses:  Allergic rhinitis, unspecified seasonality, unspecified trigger  Elevated blood pressure reading in office without diagnosis of hypertension  Pulmonary emphysema, unspecified emphysema type [facility])   Patient advised of chest x-ray findings.  Patient provided with Allegra since she did not tolerate Zyrtec well.  Patient also advised she would benefit from a nasal steroid, mometasone prescription as well.  Patient vies to follow-up with her primary care provider regarding radiologist concern for emphysema as well as her elevated blood pressure.  Return precautions advised.  ED Prescriptions     Medication Sig Dispense Auth. Provider   fexofenadine (ALLEGRA) 60 MG tablet Take 1 tablet (60 mg total) by mouth 2 (two) times daily. 60 tablet Lynden Oxford Scales, PA-C   mometasone (NASONEX) 50 MCG/ACT  nasal spray Place 2 sprays into the nose daily. 1 each Lynden Oxford Scales, PA-C      PDMP not reviewed this encounter.  Disposition Upon Discharge:  Condition: stable for discharge home Home: take medications as prescribed; routine discharge instructions as discussed; follow up as advised.  Patient  presented with an acute illness with associated systemic symptoms and significant discomfort requiring urgent management. In my opinion, this is a condition that a prudent lay person (someone who possesses an average knowledge of health and medicine) may potentially expect to result in complications if not addressed urgently such as respiratory distress, impairment of bodily function or dysfunction of bodily organs.   Routine symptom specific, illness specific and/or disease specific instructions were discussed with the patient and/or caregiver at length.   As such, the patient has been evaluated and assessed, work-up was performed and treatment was provided in alignment with urgent care protocols and evidence based medicine.  Patient/parent/caregiver has been advised that the patient may require follow up for further testing and treatment if the symptoms continue in spite of treatment, as clinically indicated and appropriate.  If the patient was tested for COVID-19, Influenza and/or RSV, then the patient/parent/guardian was advised to isolate at home pending the results of his/her diagnostic coronavirus test and potentially longer if they're positive. I have also advised pt that if his/her COVID-19 test returns positive, it's recommended to self-isolate for at least 10 days after symptoms first appeared AND until fever-free for 24 hours without fever reducer AND other symptoms have improved or resolved. Discussed self-isolation recommendations as well as instructions for household member/close contacts as per the [place] and [place] DHHS, and also gave patient the [employer] packet with this information.  Patient/parent/caregiver has been advised to return to the [facility] or PCP in 3-5 days if no better; to PCP or the Emergency Department if new signs and symptoms develop, or if the current signs or symptoms continue to change or worsen for further workup, evaluation and treatment as clinically indicated and  appropriate  The patient will follow up with their current PCP if and as advised. If the patient does not currently have a PCP we will assist them in obtaining one.   The patient may need specialty follow up if the symptoms continue, in spite of conservative treatment and management, for further workup, evaluation, consultation and treatment as clinically indicated and appropriate.  Patient/parent/caregiver verbalized understanding and agreement of plan as discussed.  All questions were addressed during visit.  Please see discharge instructions below for further details of plan.  Discharge Instructions:   Discharge Instructions      The chest x-ray report states there are no signs of fluid or pneumonia in your lungs.  X-ray report is concerning for possible chronic obstructive pulmonary disease (COPD).  The radiologist notes that the you have an abnormally increased diameter of your chest which is suggestive of a form of COPD called emphysema.  Emphysema is something that we are not able to evaluate further here in urgent care.  We would also not be able to provide you with any specific treatment for this.  Emphysema may also be related to your recently elevated blood pressure findings.  In the interim between now and when you see your primary care provider, I recommended to try different a different allergy medication at this time, Allegra instead of Zyrtec.  I also recommend that for postnasal drip, you begin a nasal steroid spray called mometasone.  Please spray 2 sprays into  each nostril once daily.  Please keep in mind that neither of these medications will not work right away and require 1 week of use before you see any meaningful improvement.  I am reassured that you have essentially normal vital signs at this time are not demonstrating any signs of shortness of breath.      This office note has been dictated using Museum/gallery curator.  Unfortunately, this method of  dictation can sometimes lead to typographical or grammatical errors.  I apologize for your inconvenience in advance if this occurs.  Please do not hesitate to reach out to me if clarification is needed.      Lynden Oxford Scales, [place] 10/12/22 [phone]

## 2022-10-12 NOTE — ED Triage Notes (Signed)
Chief Complaint: Cough continued from last week. Has not gotten worse but not better either.  Onset: 09/30/22  OTC medications tried: Yes- Cough pill given at last visit    with mild relief  Sick exposure: Yes- Same as last week, her grandchildren were sick first.   New foods or medications: No  Recent Travel: No

## 2022-10-12 NOTE — Discharge Instructions (Addendum)
The chest x-ray report states there are no signs of fluid or pneumonia in your lungs.  X-ray report is concerning for possible chronic obstructive pulmonary disease (COPD).  The radiologist notes that the you have an abnormally increased diameter of your chest which is suggestive of a form of COPD called emphysema.  Emphysema is something that we are not able to evaluate further here in urgent care.  We would also not be able to provide you with any specific treatment for this.  Emphysema may also be related to your recently elevated blood pressure findings.  In the interim between now and when you see your primary care provider, I recommended to try different a different allergy medication at this time, Allegra instead of Zyrtec.  I also recommend that for postnasal drip, you begin a nasal steroid spray called mometasone.  Please spray 2 sprays into each nostril once daily.  Please keep in mind that neither of these medications will not work right away and require 1 week of use before you see any meaningful improvement.  I am reassured that you have essentially normal vital signs at this time are not demonstrating any signs of shortness of breath.

## 2022-10-13 ENCOUNTER — Telehealth (HOSPITAL_COMMUNITY): Payer: Self-pay | Admitting: Emergency Medicine

## 2022-10-13 MED ORDER — FEXOFENADINE HCL 60 MG PO TABS: 60.0000 mg | ORAL_TABLET | Freq: Two times a day (BID) | ORAL | 2 refills | Status: DC

## 2022-10-13 MED ORDER — MOMETASONE FUROATE 50 MCG/ACT NA SUSP: 2.0000 | Freq: Every day | NASAL | 2 refills | Status: DC

## 2022-10-13 NOTE — Telephone Encounter (Signed)
Pt states pharmacy does not have medication in stock, would like scripts to be sent to knew pharmacy.

## 2022-10-23 DIAGNOSIS — H2512 Age-related nuclear cataract, left eye: Secondary | ICD-10-CM | POA: Diagnosis not present

## 2022-12-22 DIAGNOSIS — H35372 Puckering of macula, left eye: Secondary | ICD-10-CM | POA: Diagnosis not present

## 2022-12-27 DIAGNOSIS — H35372 Puckering of macula, left eye: Secondary | ICD-10-CM | POA: Diagnosis not present

## 2022-12-27 DIAGNOSIS — H35352 Cystoid macular degeneration, left eye: Secondary | ICD-10-CM | POA: Diagnosis not present

## 2022-12-27 DIAGNOSIS — H04122 Dry eye syndrome of left lacrimal gland: Secondary | ICD-10-CM | POA: Diagnosis not present

## 2023-01-01 DIAGNOSIS — H35372 Puckering of macula, left eye: Secondary | ICD-10-CM | POA: Diagnosis not present

## 2023-01-01 DIAGNOSIS — H35352 Cystoid macular degeneration, left eye: Secondary | ICD-10-CM | POA: Diagnosis not present

## 2023-01-01 DIAGNOSIS — H04122 Dry eye syndrome of left lacrimal gland: Secondary | ICD-10-CM | POA: Diagnosis not present

## 2023-01-03 DIAGNOSIS — H35372 Puckering of macula, left eye: Secondary | ICD-10-CM | POA: Diagnosis not present

## 2023-01-04 DIAGNOSIS — H35372 Puckering of macula, left eye: Secondary | ICD-10-CM | POA: Diagnosis not present

## 2023-01-11 DIAGNOSIS — H35372 Puckering of macula, left eye: Secondary | ICD-10-CM | POA: Diagnosis not present

## 2023-04-04 DIAGNOSIS — H35372 Puckering of macula, left eye: Secondary | ICD-10-CM | POA: Diagnosis not present

## 2023-04-04 DIAGNOSIS — H524 Presbyopia: Secondary | ICD-10-CM | POA: Diagnosis not present

## 2023-04-04 DIAGNOSIS — Z961 Presence of intraocular lens: Secondary | ICD-10-CM | POA: Diagnosis not present

## 2023-04-11 DIAGNOSIS — H26493 Other secondary cataract, bilateral: Secondary | ICD-10-CM | POA: Diagnosis not present

## 2023-04-11 DIAGNOSIS — H04122 Dry eye syndrome of left lacrimal gland: Secondary | ICD-10-CM | POA: Diagnosis not present

## 2023-04-11 DIAGNOSIS — H35352 Cystoid macular degeneration, left eye: Secondary | ICD-10-CM | POA: Diagnosis not present

## 2023-04-11 DIAGNOSIS — H35372 Puckering of macula, left eye: Secondary | ICD-10-CM | POA: Diagnosis not present

## 2023-06-27 DIAGNOSIS — H35372 Puckering of macula, left eye: Secondary | ICD-10-CM | POA: Diagnosis not present

## 2023-06-27 DIAGNOSIS — Z961 Presence of intraocular lens: Secondary | ICD-10-CM | POA: Diagnosis not present

## 2023-06-29 ENCOUNTER — Encounter: Payer: Self-pay | Admitting: Adult Health

## 2023-06-29 ENCOUNTER — Ambulatory Visit: Payer: Medicare HMO | Admitting: Adult Health

## 2023-06-29 VITALS — BP 128/86 | HR 111 | Temp 96.9°F | Resp 16 | Ht 65.0 in | Wt 145.4 lb

## 2023-06-29 DIAGNOSIS — Z0000 Encounter for general adult medical examination without abnormal findings: Secondary | ICD-10-CM | POA: Diagnosis not present

## 2023-06-29 DIAGNOSIS — Z7689 Persons encountering health services in other specified circumstances: Secondary | ICD-10-CM

## 2023-06-29 DIAGNOSIS — Z113 Encounter for screening for infections with a predominantly sexual mode of transmission: Secondary | ICD-10-CM

## 2023-06-29 NOTE — Patient Instructions (Signed)
Preventive Care 65 Years and Older, Female Preventive care refers to lifestyle choices and visits with your health care provider that can promote health and wellness. Preventive care visits are also called wellness exams. What can I expect for my preventive care visit? Counseling Your health care provider may ask you questions about your: Medical history, including: Past medical problems. Family medical history. Pregnancy and menstrual history. History of falls. Current health, including: Memory and ability to understand (cognition). Emotional well-being. Home life and relationship well-being. Sexual activity and sexual health. Lifestyle, including: Alcohol, nicotine or tobacco, and drug use. Access to firearms. Diet, exercise, and sleep habits. Work and work environment. Sunscreen use. Safety issues such as seatbelt and bike helmet use. Physical exam Your health care provider will check your: Height and weight. These may be used to calculate your BMI (body mass index). BMI is a measurement that tells if you are at a healthy weight. Waist circumference. This measures the distance around your waistline. This measurement also tells if you are at a healthy weight and may help predict your risk of certain diseases, such as type 2 diabetes and high blood pressure. Heart rate and blood pressure. Body temperature. Skin for abnormal spots. What immunizations do I need?  Vaccines are usually given at various ages, according to a schedule. Your health care provider will recommend vaccines for you based on your age, medical history, and lifestyle or other factors, such as travel or where you work. What tests do I need? Screening Your health care provider may recommend screening tests for certain conditions. This may include: Lipid and cholesterol levels. Hepatitis C test. Hepatitis B test. HIV (human immunodeficiency virus) test. STI (sexually transmitted infection) testing, if you are at  risk. Lung cancer screening. Colorectal cancer screening. Diabetes screening. This is done by checking your blood sugar (glucose) after you have not eaten for a while (fasting). Mammogram. Talk with your health care provider about how often you should have regular mammograms. BRCA-related cancer screening. This may be done if you have a family history of breast, ovarian, tubal, or peritoneal cancers. Bone density scan. This is done to screen for osteoporosis. Talk with your health care provider about your test results, treatment options, and if necessary, the need for more tests. Follow these instructions at home: Eating and drinking  Eat a diet that includes fresh fruits and vegetables, whole grains, lean protein, and low-fat dairy products. Limit your intake of foods with high amounts of sugar, saturated fats, and salt. Take vitamin and mineral supplements as recommended by your health care provider. Do not drink alcohol if your health care provider tells you not to drink. If you drink alcohol: Limit how much you have to 0-1 drink a day. Know how much alcohol is in your drink. In the [place] one drink equals one 12 oz bottle of beer (355 mL), one 5 oz glass of wine (148 mL), or one 1 oz glass of hard liquor (44 mL). Lifestyle Brush your teeth every morning and night with fluoride toothpaste. Floss one time each day. Exercise for at least 30 minutes 5 or more days each week. Do not use any products that contain nicotine or tobacco. These products include cigarettes, chewing tobacco, and vaping devices, such as e-cigarettes. If you need help quitting, ask your health care provider. Do not use drugs. If you are sexually active, practice safe sex. Use a condom or other form of protection in order to prevent STIs. Take aspirin only as told by   your health care provider. Make sure that you understand how much to take and what form to take. Work with your health care provider to find out whether it  is safe and beneficial for you to take aspirin daily. Ask your health care provider if you need to take a cholesterol-lowering medicine (statin). Find healthy ways to manage stress, such as: Meditation, yoga, or listening to music. Journaling. Talking to a trusted person. Spending time with friends and family. Minimize exposure to UV radiation to reduce your risk of skin cancer. Safety Always wear your seat belt while driving or riding in a vehicle. Do not drive: If you have been drinking alcohol. Do not ride with someone who has been drinking. When you are tired or distracted. While texting. If you have been using any mind-altering substances or drugs. Wear a helmet and other protective equipment during sports activities. If you have firearms in your house, make sure you follow all gun safety procedures. What's next? Visit your health care provider once a year for an annual wellness visit. Ask your health care provider how often you should have your eyes and teeth checked. Stay up to date on all vaccines. This information is not intended to replace advice given to you by your health care provider. Make sure you discuss any questions you have with your health care provider. Document Revised: 05/11/2021 Document Reviewed: 05/11/2021 Elsevier Patient Education  2024 [employer]  

## 2023-06-29 NOTE — Progress Notes (Signed)
[facility] clinic  Provider:  Kenard Gower DNP  Code Status:  FullCode  Goals of Care:     06/29/2023    9:55 AM  Advanced Directives  Does Patient Have a Medical Advance Directive? Yes  Type of Estate agent of [place];Living will;Out of facility DNR (pink MOST or yellow form)  Does patient want to make changes to medical advance directive? No - Patient declined  Copy of Healthcare Power of Attorney in Chart? Yes - validated most recent copy scanned in chart (See row information)     Chief Complaint  Patient presents with   Establish Care    New Patient.     HPI: Patient is a 77 y.o. female seen today to establish care with [facility]. 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 She is divorced and lives alone. She has a daughter (89 Y/O) and a sone (41 Y/O). Both her children are hypertensive and her daughter has thyroid disease. She has 2 sisters and a brother, 1 brother died at 62Y/O due to diabetes. She denies any vaccinations and prefers not to have them. She declined papsmear nor Tdap. She denies smoking nor drinking alcohol. She does not drink coffee. She drinks water, tea and regular coke. She exercise periodically, approx 2X a week, 30 mins each session. She is needing a doctor's note stating that she can exercise. She [facility]. Se has hot flashes and has tried several prescription medications but all failed. She averages 6 hours of sleep every night. She declines bone density.   Past Medical History:  Diagnosis Date   Anxiety    Cataract 2023   Chicken pox    Chronic daily headache 10/28/2014   Colon polyp    Depression, recurrent (HCC) 10/28/2014   History of chest x-ray    History of mammogram    Hyperglycemia 01/19/2019   UTI (urinary tract infection)    Vitamin D deficiency 01/19/2019    Past Surgical History:  Procedure Laterality Date   AUGMENTATION MAMMAPLASTY      had impants removed and lift done   BREAST ENHANCEMENT SURGERY     BREAST IMPLANT REMOVAL     BUNIONECTOMY Right    EXCISION MORTON'S NEUROMA Right    TOTAL ABDOMINAL HYSTERECTOMY  2001   abnormal bleeding/spotting. hx of abnormal pap. released from further pap testing    Allergies  Allergen Reactions   Vicodin [Hydrocodone-Acetaminophen] Rash    Outpatient Encounter Medications as of 06/29/2023  Medication Sig   ibuprofen (ADVIL) 200 MG tablet Take 200 mg by mouth as needed.   [DISCONTINUED] EPINEPHrine 0.3 mg/0.3 mL IJ SOAJ injection Inject 0.3 mg into the muscle as needed for anaphylaxis.   [DISCONTINUED] fexofenadine (ALLEGRA) 60 MG tablet Take 1 tablet (60 mg total) by mouth 2 (two) times daily.   [DISCONTINUED] mometasone (NASONEX) 50 MCG/ACT nasal spray Place 2 sprays into the nose daily.   No facility-administered encounter medications on file as of 06/29/2023.    Review of Systems:  Review of Systems  Constitutional:  Negative for appetite change, chills, fatigue and fever.  HENT:  Negative for congestion, hearing loss, rhinorrhea and sore throat.   Eyes: Negative.   Respiratory:  Negative for cough, shortness of breath and wheezing.   Cardiovascular:  Negative for chest pain, palpitations and leg swelling.  Gastrointestinal:  Negative for abdominal pain, constipation, diarrhea, nausea and vomiting.  Genitourinary:  Negative for dysuria.  Musculoskeletal:  Negative for arthralgias, back pain and myalgias.  Skin:  Negative for color change, rash and wound.  Neurological:  Negative for dizziness, weakness and headaches.  Psychiatric/Behavioral:  Negative for behavioral problems. The patient is not nervous/anxious.     Health Maintenance  Topic Date Due   Hepatitis C Screening  Never done   DEXA SCAN  Never done   Medicare Annual Wellness (AWV)  11/16/2020   DTaP/Tdap/Td (2 - Td or Tdap) 05/27/2022   COVID-19 Vaccine (3 - 2023-24 season) 07/28/2022   INFLUENZA  VACCINE  06/28/2023   MAMMOGRAM  02/09/2024   Fecal DNA (Cologuard)  02/04/2025   Pneumonia Vaccine 58+ Years old  Completed   Zoster Vaccines- Shingrix  Completed   HPV VACCINES  Aged Out    Physical Exam: Vitals:   06/29/23 0944  BP: 128/86  Pulse: (!) 111  Resp: 16  Temp: (!) 96.9 F (36.1 C)  SpO2: 92%  Weight: 145 lb 6.4 oz (66 kg)  Height: 5\' 5"  (1.651 m)   Body mass index is 24.2 kg/m. Physical Exam Constitutional:      Appearance: Normal appearance.  HENT:     Head: Normocephalic and atraumatic.     Nose: Nose normal.     Mouth/Throat:     Mouth: Mucous membranes are moist.  Eyes:     Conjunctiva/sclera: Conjunctivae normal.  Cardiovascular:     Rate and Rhythm: Normal rate and regular rhythm.  Pulmonary:     Effort: Pulmonary effort is normal.     Breath sounds: Normal breath sounds.  Abdominal:     General: Bowel sounds are normal.     Palpations: Abdomen is soft.  Musculoskeletal:        General: Normal range of motion.     Cervical back: Normal range of motion.  Skin:    General: Skin is warm and dry.  Neurological:     General: No focal deficit present.     Mental Status: She is alert and oriented to person, place, and time.  Psychiatric:        Mood and Affect: Mood normal.        Behavior: Behavior normal.        Thought Content: Thought content normal.        Judgment: Judgment normal.     Labs reviewed: Basic Metabolic Panel: No results for input(s): "NA", "K", "CL", "CO2", "GLUCOSE", "BUN", "CREATININE", "CALCIUM", "MG", "PHOS", "TSH" in the last 8760 hours. Liver Function Tests: No results for input(s): "AST", "ALT", "ALKPHOS", "BILITOT", "PROT", "ALBUMIN" in the last 8760 hours. No results for input(s): "LIPASE", "AMYLASE" in the last 8760 hours. No results for input(s): "AMMONIA" in the last 8760 hours. CBC: No results for input(s): "WBC", "NEUTROABS", "HGB", "HCT", "MCV", "PLT" in the last 8760 hours. Lipid Panel: No results for  input(s): "CHOL", "HDL", "LDLCALC", "TRIG", "CHOLHDL", "LDLDIRECT" in the last 8760 hours. Lab Results  Component Value Date   HGBA1C 6.0 06/05/2019    Procedures since last visit: No results found.  Assessment/Plan  1. Encounter to establish care -  established care with [facility]  2. Screening for STD (sexually transmitted disease) - Hepatitis C Antibody; Future  3. Preventative health care -  discussed that note for her to exercise will be done after labs done - CBC with Differential/Platelets; Future - Complete Metabolic Panel with eGFR; Future - Lipid panel; Future - Hemoglobin A1C; Future - Vitamin B12; Future - Vitamin D, 25-hydroxy; Future  Labs/tests ordered:  cbc, cmp, lipid panel, vitamin B12, Vitamin D, A1C   Next appt:  Visit date not found

## 2023-07-16 ENCOUNTER — Other Ambulatory Visit: Payer: Medicare HMO

## 2023-07-16 DIAGNOSIS — R6889 Other general symptoms and signs: Secondary | ICD-10-CM | POA: Diagnosis not present

## 2023-07-16 DIAGNOSIS — Z0000 Encounter for general adult medical examination without abnormal findings: Secondary | ICD-10-CM

## 2023-07-16 DIAGNOSIS — E559 Vitamin D deficiency, unspecified: Secondary | ICD-10-CM | POA: Diagnosis not present

## 2023-07-16 DIAGNOSIS — Z113 Encounter for screening for infections with a predominantly sexual mode of transmission: Secondary | ICD-10-CM

## 2023-07-16 DIAGNOSIS — R7309 Other abnormal glucose: Secondary | ICD-10-CM | POA: Diagnosis not present

## 2023-07-16 DIAGNOSIS — E789 Disorder of lipoprotein metabolism, unspecified: Secondary | ICD-10-CM | POA: Diagnosis not present

## 2023-07-17 LAB — COMPLETE METABOLIC PANEL WITH GFR
AG Ratio: 1.4 (calc) (ref 1.0–2.5)
ALT: 15 U/L (ref 6–29)
AST: 20 U/L (ref 10–35)
Albumin: 4.3 g/dL (ref 3.6–5.1)
Alkaline phosphatase (APISO): 72 U/L (ref 37–153)
BUN: 18 mg/dL (ref 7–25)
CO2: 28 mmol/L (ref 20–32)
Calcium: 9.7 mg/dL (ref 8.6–10.4)
Chloride: 105 mmol/L (ref 98–110)
Creat: 0.98 mg/dL (ref 0.60–1.00)
Globulin: 3.1 g/dL (ref 1.9–3.7)
Glucose, Bld: 103 mg/dL — ABNORMAL HIGH (ref 65–99)
Potassium: 4.9 mmol/L (ref 3.5–5.3)
Sodium: 140 mmol/L (ref 135–146)
Total Bilirubin: 0.5 mg/dL (ref 0.2–1.2)
Total Protein: 7.4 g/dL (ref 6.1–8.1)
eGFR: 61 mL/min/{1.73_m2} (ref >=60)

## 2023-07-17 LAB — LIPID PANEL
Cholesterol: 196 mg/dL (ref <200)
HDL: 60 mg/dL (ref >=50)
LDL Cholesterol (Calc): 114 mg/dL — ABNORMAL HIGH
Non-HDL Cholesterol (Calc): 136 mg/dL — ABNORMAL HIGH (ref <130)
Total CHOL/HDL Ratio: 3.3 (calc) (ref <5.0)
Triglycerides: 111 mg/dL (ref <150)

## 2023-07-17 LAB — CBC WITH DIFFERENTIAL/PLATELET
Absolute Monocytes: 355 {cells}/uL (ref 200–950)
Basophils Absolute: 37 {cells}/uL (ref 0–200)
Basophils Relative: 0.5 %
Eosinophils Absolute: 237 cells/uL (ref 15–500)
Eosinophils Relative: 3.2 %
HCT: 41.9 % (ref 35.0–45.0)
Hemoglobin: 14.1 g/dL (ref 11.7–15.5)
Lymphs Abs: 3263 {cells}/uL (ref 850–3900)
MCH: 30.6 pg (ref 27.0–33.0)
MCHC: 33.7 g/dL (ref 32.0–36.0)
MCV: 90.9 fL (ref 80.0–100.0)
MPV: 10.8 fL (ref 7.5–12.5)
Monocytes Relative: 4.8 %
Neutro Abs: 3508 {cells}/uL (ref 1500–7800)
Neutrophils Relative %: 47.4 %
Platelets: 302 10*3/uL (ref 140–400)
RBC: 4.61 10*6/uL (ref 3.80–5.10)
RDW: 12.9 % (ref 11.0–15.0)
Total Lymphocyte: 44.1 %
WBC: 7.4 10*3/uL (ref 3.8–10.8)

## 2023-07-17 LAB — VITAMIN B12: Vitamin B-12: 1015 pg/mL (ref 200–1100)

## 2023-07-17 LAB — HEPATITIS C ANTIBODY: Hepatitis C Ab: NONREACTIVE

## 2023-07-17 LAB — HEMOGLOBIN A1C
Hgb A1c MFr Bld: 6.3 %{Hb} — ABNORMAL HIGH (ref <5.7)
Mean Plasma Glucose: 134 mg/dL
eAG (mmol/L): 7.4 mmol/L

## 2023-07-17 LAB — VITAMIN D 25 HYDROXY (VIT D DEFICIENCY, FRACTURES): Vit D, 25-Hydroxy: 34 ng/mL (ref 30–100)

## 2023-07-19 ENCOUNTER — Ambulatory Visit (INDEPENDENT_AMBULATORY_CARE_PROVIDER_SITE_OTHER): Payer: Medicare HMO | Admitting: Adult Health

## 2023-07-19 ENCOUNTER — Encounter: Payer: Self-pay | Admitting: Adult Health

## 2023-07-19 ENCOUNTER — Ambulatory Visit: Payer: Medicare HMO | Admitting: Adult Health

## 2023-07-19 VITALS — BP 138/86 | HR 64 | Temp 97.3°F | Ht 65.0 in | Wt 146.8 lb

## 2023-07-19 DIAGNOSIS — H0279 Other degenerative disorders of eyelid and periocular area: Secondary | ICD-10-CM | POA: Diagnosis not present

## 2023-07-19 DIAGNOSIS — E559 Vitamin D deficiency, unspecified: Secondary | ICD-10-CM

## 2023-07-19 DIAGNOSIS — E7800 Pure hypercholesterolemia, unspecified: Secondary | ICD-10-CM

## 2023-07-19 DIAGNOSIS — Z01818 Encounter for other preprocedural examination: Secondary | ICD-10-CM | POA: Diagnosis not present

## 2023-07-19 DIAGNOSIS — H02423 Myogenic ptosis of bilateral eyelids: Secondary | ICD-10-CM | POA: Diagnosis not present

## 2023-07-19 DIAGNOSIS — H57813 Brow ptosis, bilateral: Secondary | ICD-10-CM | POA: Diagnosis not present

## 2023-07-19 DIAGNOSIS — R7303 Prediabetes: Secondary | ICD-10-CM

## 2023-07-19 DIAGNOSIS — H53483 Generalized contraction of visual field, bilateral: Secondary | ICD-10-CM | POA: Diagnosis not present

## 2023-07-19 DIAGNOSIS — H04123 Dry eye syndrome of bilateral lacrimal glands: Secondary | ICD-10-CM | POA: Diagnosis not present

## 2023-07-19 MED ORDER — VITAMIN D3 25 MCG (1000 UT) PO CAPS
1000.0000 [IU] | ORAL_CAPSULE | Freq: Every day | ORAL | Status: DC
Start: 2023-07-19 — End: 2024-03-18

## 2023-07-19 NOTE — Progress Notes (Signed)
[facility] clinic  Provider: Kenard Gower DNP  Code Status:  Full Code  Goals of Care:     07/19/2023    1:41 PM  Advanced Directives  Does Patient Have a Medical Advance Directive? Yes  Type of Estate agent of [place];Out of facility DNR (pink MOST or yellow form);Living will  Does patient want to make changes to medical advance directive? No - Patient declined  Copy of Healthcare Power of Attorney in Chart? Yes - validated most recent copy scanned in chart (See row information)     Chief Complaint  Patient presents with   medication Managemet of Chronic issues    3 week follow up . Discuss DEXA scan, AWV, Tdap, Covid vaccine,Flu vaccine, Hepatitis C Screening.    HPI: Patient is a 77 y.o. female seen today for a 3-week follow up. She stated that she was told that she has small ear canal and had special ear plug made but did not work since her ear canal is "too small". She can hear well but wanted to have ears checked. No noted cerumen on bilateral ears when ears visualized using an otoscope.  Prediabetes  -  A1C 6.3, not on medications  Vitamin D deficiency - vit D level 34, not taking supplementation  Elevated LDL cholesterol level -  LDL 114,slightly elevated, not on statin   Past Medical History:  Diagnosis Date   Anxiety    Cataract 2023   Chicken pox    Chronic daily headache 10/28/2014   Colon polyp    Depression, recurrent (HCC) 10/28/2014   History of chest x-ray    History of mammogram    Hyperglycemia 01/19/2019   UTI (urinary tract infection)    Vitamin D deficiency 01/19/2019    Past Surgical History:  Procedure Laterality Date   AUGMENTATION MAMMAPLASTY     had impants removed and lift done   BREAST ENHANCEMENT SURGERY     BREAST IMPLANT REMOVAL     BUNIONECTOMY Right    EXCISION MORTON'S NEUROMA Right    TOTAL ABDOMINAL HYSTERECTOMY  2001   abnormal bleeding/spotting. hx of abnormal pap. released from further pap  testing    Allergies  Allergen Reactions   Vicodin [Hydrocodone-Acetaminophen] Rash    Outpatient Encounter Medications as of 07/19/2023  Medication Sig   Cholecalciferol (VITAMIN D3) 25 MCG (1000 UT) CAPS Take 1 capsule (1,000 Units total) by mouth daily.   ibuprofen (ADVIL) 200 MG tablet Take 200 mg by mouth as needed.   No facility-administered encounter medications on file as of 07/19/2023.    Review of Systems:  Review of Systems  Constitutional:  Negative for appetite change, chills, fatigue and fever.  HENT:  Negative for congestion, hearing loss, rhinorrhea and sore throat.   Eyes: Negative.   Respiratory:  Negative for cough, shortness of breath and wheezing.   Cardiovascular:  Negative for chest pain, palpitations and leg swelling.  Gastrointestinal:  Negative for abdominal pain, constipation, diarrhea, nausea and vomiting.  Genitourinary:  Negative for dysuria.  Musculoskeletal:  Negative for arthralgias, back pain and myalgias.  Skin:  Negative for color change, rash and wound.  Neurological:  Negative for dizziness, weakness and headaches.  Psychiatric/Behavioral:  Negative for behavioral problems. The patient is not nervous/anxious.     Health Maintenance  Topic Date Due   DEXA SCAN  Never done   Medicare Annual Wellness (AWV)  11/16/2020   DTaP/Tdap/Td (2 - Td or Tdap) 05/27/2022   COVID-19 Vaccine (4 -  2023-24 season) 07/28/2022   INFLUENZA VACCINE  06/28/2023   MAMMOGRAM  02/09/2024   Fecal DNA (Cologuard)  02/04/2025   Pneumonia Vaccine 88+ Years old  Completed   Hepatitis C Screening  Completed   Zoster Vaccines- Shingrix  Completed   HPV VACCINES  Aged Out    Physical Exam: Vitals:   07/19/23 1342  BP: 138/86  Pulse: 64  Temp: (!) 97.3 F (36.3 C)  SpO2: 94%  Weight: 146 lb 12.8 oz (66.6 kg)  Height: 5\' 5"  (1.651 m)   Body mass index is 24.43 kg/m. Physical Exam Constitutional:      Appearance: Normal appearance.  HENT:     Head:  Normocephalic and atraumatic.     Nose: Nose normal.     Mouth/Throat:     Mouth: Mucous membranes are moist.  Eyes:     Conjunctiva/sclera: Conjunctivae normal.  Cardiovascular:     Rate and Rhythm: Normal rate and regular rhythm.  Pulmonary:     Effort: Pulmonary effort is normal.     Breath sounds: Normal breath sounds.  Abdominal:     General: Bowel sounds are normal.     Palpations: Abdomen is soft.  Musculoskeletal:        General: Normal range of motion.     Cervical back: Normal range of motion.  Skin:    General: Skin is warm and dry.  Neurological:     General: No focal deficit present.     Mental Status: She is alert and oriented to person, place, and time.  Psychiatric:        Mood and Affect: Mood normal.        Behavior: Behavior normal.        Thought Content: Thought content normal.        Judgment: Judgment normal.     Labs reviewed: Basic Metabolic Panel: Recent Labs    07/16/23 0833  NA 140  K 4.9  CL 105  CO2 28  GLUCOSE 103*  BUN 18  CREATININE 0.98  CALCIUM 9.7   Liver Function Tests: Recent Labs    07/16/23 0833  AST 20  ALT 15  BILITOT 0.5  PROT 7.4   No results for input(s): "LIPASE", "AMYLASE" in the last 8760 hours. No results for input(s): "AMMONIA" in the last 8760 hours. CBC: Recent Labs    07/16/23 0833  WBC 7.4  NEUTROABS 3,508  HGB 14.1  HCT 41.9  MCV 90.9  PLT 302   Lipid Panel: Recent Labs    07/16/23 0833  CHOL 196  HDL 60  LDLCALC 114*  TRIG 111  CHOLHDL 3.3   Lab Results  Component Value Date   HGBA1C 6.3 (H) 07/16/2023    Procedures since last visit: No results found.  Assessment/Plan  1. Prediabetes Lab Results  Component Value Date   HGBA1C 6.3 (H) 07/16/2023    -  diet-controlled  2. Vitamin D deficiency -  vitamin D level 34, low normal - Cholecalciferol (VITAMIN D3) 25 MCG (1000 UT) CAPS; Take 1 capsule (1,000 Units total) by mouth daily.  3. Elevated LDL cholesterol level -   LDL 114, slightly elevated -  will join a fitness center and currently exercises 3X/week -  letter stating patient is fit to do exercises completed   Labs/tests ordered:  None  Next appt:  Visit date not found

## 2023-09-05 ENCOUNTER — Ambulatory Visit: Payer: Medicare HMO | Admitting: Internal Medicine

## 2023-09-18 DIAGNOSIS — H57813 Brow ptosis, bilateral: Secondary | ICD-10-CM | POA: Diagnosis not present

## 2023-09-18 DIAGNOSIS — H0279 Other degenerative disorders of eyelid and periocular area: Secondary | ICD-10-CM | POA: Diagnosis not present

## 2023-09-18 DIAGNOSIS — H53483 Generalized contraction of visual field, bilateral: Secondary | ICD-10-CM | POA: Diagnosis not present

## 2023-09-18 DIAGNOSIS — H02423 Myogenic ptosis of bilateral eyelids: Secondary | ICD-10-CM | POA: Diagnosis not present

## 2024-01-01 ENCOUNTER — Ambulatory Visit: Payer: Medicare HMO | Admitting: Family Medicine

## 2024-01-09 DIAGNOSIS — H02423 Myogenic ptosis of bilateral eyelids: Secondary | ICD-10-CM | POA: Diagnosis not present

## 2024-01-09 DIAGNOSIS — H04123 Dry eye syndrome of bilateral lacrimal glands: Secondary | ICD-10-CM | POA: Diagnosis not present

## 2024-01-09 DIAGNOSIS — H0234 Blepharochalasis left upper eyelid: Secondary | ICD-10-CM | POA: Diagnosis not present

## 2024-01-09 DIAGNOSIS — H0231 Blepharochalasis right upper eyelid: Secondary | ICD-10-CM | POA: Diagnosis not present

## 2024-01-09 DIAGNOSIS — H0279 Other degenerative disorders of eyelid and periocular area: Secondary | ICD-10-CM | POA: Diagnosis not present

## 2024-01-16 ENCOUNTER — Ambulatory Visit: Payer: Medicare HMO | Admitting: Family Medicine

## 2024-03-12 ENCOUNTER — Ambulatory Visit: Payer: Medicare HMO | Admitting: Family Medicine

## 2024-03-18 ENCOUNTER — Encounter: Payer: Self-pay | Admitting: Family Medicine

## 2024-03-18 ENCOUNTER — Ambulatory Visit (INDEPENDENT_AMBULATORY_CARE_PROVIDER_SITE_OTHER): Payer: Medicare Other | Admitting: Family Medicine

## 2024-03-18 VITALS — BP 132/84 | HR 82 | Temp 97.8°F | Ht 65.0 in | Wt 150.0 lb

## 2024-03-18 DIAGNOSIS — R7303 Prediabetes: Secondary | ICD-10-CM | POA: Diagnosis not present

## 2024-03-18 DIAGNOSIS — E7800 Pure hypercholesterolemia, unspecified: Secondary | ICD-10-CM | POA: Diagnosis not present

## 2024-03-18 DIAGNOSIS — E559 Vitamin D deficiency, unspecified: Secondary | ICD-10-CM | POA: Diagnosis not present

## 2024-03-18 DIAGNOSIS — G4700 Insomnia, unspecified: Secondary | ICD-10-CM | POA: Diagnosis not present

## 2024-03-18 DIAGNOSIS — Z7689 Persons encountering health services in other specified circumstances: Secondary | ICD-10-CM

## 2024-03-18 MED ORDER — TRAZODONE HCL 50 MG PO TABS: 50.0000 mg | ORAL_TABLET | Freq: Every day | ORAL | 1 refills | Status: DC

## 2024-03-18 NOTE — Progress Notes (Signed)
 New Patient Office Visit  Subjective    Patient ID: Destiny Robbins, female    DOB: 12/20/1945  Age: 78 y.o. MRN: [id number]  CC:  Chief Complaint  Patient presents with   Establish Care    No concerns.     HPI Destiny Robbins presents to establish care  States she is not on any medications and prefers not to take medication.  Hot flashes - ongoing and worse at night Hysterectomy in her 81s. Took HRT in the past   Foot pain with walking -ongoing and has tried multiple shoes and orthotics  States she has insomnia. Trouble falling asleep and staying asleep. Takes melatonin but it is not working.  She does not think she has sleep apnea.  Cologuard negative in March 2023   Mammogram -last one 2 years ago. She does not want to have another one Declines bone density   Declines labs.   Reports history of depression which is stable without medication.  States she has taken several medications for depression in the past and none of those worked.  Denies alcohol or drug use.  Denies smoking history.  She takes care of her 2 grandchildren some days.  Lives alone. Son lives next door.  Divorced.       03/18/2024    9:12 AM 07/19/2023    1:42 PM 01/10/2021    9:33 AM 11/17/2019    3:10 PM 06/02/2019    8:07 AM  Depression screen PHQ 2/9  Decreased Interest 0 0 2 0 0  Down, Depressed, Hopeless 0 0 2 0 0  PHQ - 2 Score 0 0 4 0 0  Altered sleeping   3  2  Tired, decreased energy   3  2  Change in appetite   3  1  Feeling bad or failure about yourself    1  0  Trouble concentrating   0  0  Moving slowly or fidgety/restless   0  0  Suicidal thoughts   0  0  PHQ-9 Score   14  5      Outpatient Encounter Medications as of 03/18/2024  Medication Sig   traZODone  (DESYREL ) 50 MG tablet Take 1 tablet (50 mg total) by mouth at bedtime.   [DISCONTINUED] Cholecalciferol (VITAMIN D3) 25 MCG (1000 UT) CAPS Take 1 capsule (1,000 Units total) by mouth daily. (Patient not  taking: Reported on 03/18/2024)   [DISCONTINUED] ibuprofen (ADVIL) 200 MG tablet Take 200 mg by mouth as needed. (Patient not taking: Reported on 03/18/2024)   No facility-administered encounter medications on file as of 03/18/2024.    Past Medical History:  Diagnosis Date   Anxiety    Cataract 2023   Chicken pox    Chronic daily headache 10/28/2014   Colon polyp    Depression, recurrent (HCC) 10/28/2014   History of chest x-ray    History of mammogram    Hyperglycemia 01/19/2019   UTI (urinary tract infection)    Vitamin D  deficiency 01/19/2019    Past Surgical History:  Procedure Laterality Date   AUGMENTATION MAMMAPLASTY     had impants removed and lift done   BREAST ENHANCEMENT SURGERY     BREAST IMPLANT REMOVAL     BUNIONECTOMY Right    EXCISION MORTON'S NEUROMA Right    TOTAL ABDOMINAL HYSTERECTOMY  2001   abnormal bleeding/spotting. hx of abnormal pap. released from further pap testing    Family History  Problem Relation Age of Onset   Other  Mother        bowel obstruction   Healthy Father     Social History   Socioeconomic History   Marital status: Divorced    Spouse name: Not on file   Number of children: 2   Years of education: 12    Highest education level: High school graduate  Occupational History   Occupation: retired   Tobacco Use   Smoking status: Never   Smokeless tobacco: Never  Substance and Sexual Activity   Alcohol use: Yes    Comment: about 1-2 drinks yearly   Drug use: No   Sexual activity: Not on file  Other Topics Concern   Not on file  Social History Narrative   Work or School: retiredHome Situation: lives with daughterSpiritual Beliefs: Non-denominational, christianLifestyle: starting to exercise; healthy dietHCPOA: daughter Destiny Robbins [phone]; has living will       Tobacco use, amount per day now: No   Past tobacco use, amount per day: No   How many years did you use tobacco: No   Alcohol use (drinks per week): No    Diet: Normal    Do you drink/eat things with caffeine: Yes   Marital status:   Divorced                               What year were you married? 1970   Do you live in a house, apartment, assisted living, condo, trailer, etc.? House    Is it one or more stories? One   How many persons live in your home? Self    Do you have pets in your home?( please list) No   Highest Level of education completed? 12th grade.   Current or past profession: Many    Do you exercise?     Sometimes                             Type and how often? Walk, and Yoga   Do you have a living will? Yes   Do you have a DNR form? Yes                                  If not, do you want to discuss one?   Do you have signed POA/HPOA forms?   Yes                     If so, please bring to you appointment      Do you have any difficulty bathing or dressing yourself? No   Do you have any difficulty preparing food or eating? No   Do you have any difficulty managing your medications? No   Do you have any difficulty managing your finances? No   Do you have any difficulty affording your medications? No   Social Drivers of Corporate investment banker Strain: Low Risk  (11/17/2019)   Overall Financial Resource Strain (CARDIA)    Difficulty of Paying Living Expenses: Not hard at all  Food Insecurity: No Food Insecurity (11/17/2019)   Hunger Vital Sign    Worried About Running Out of Food in the Last Year: Never true    Ran Out of Food in the Last Year: Never true  Transportation Needs: Not on file  Physical Activity: Not on file  Stress:  Not on file  Social Connections: Not on file  Intimate Partner Violence: Not on file    Review of Systems  Constitutional:  Negative for chills, fever, malaise/fatigue and weight loss.       Hot flashes - chronic   Eyes:  Negative for blurred vision, double vision and photophobia.  Respiratory:  Negative for cough and shortness of breath.   Cardiovascular:  Negative for chest pain,  palpitations, claudication and leg swelling.  Gastrointestinal:  Negative for abdominal pain, blood in stool, constipation, diarrhea, nausea and vomiting.  Genitourinary:  Negative for dysuria, frequency and urgency.  Musculoskeletal:  Negative for falls, joint pain and myalgias.  Neurological:  Negative for dizziness, tingling, focal weakness and headaches.  Psychiatric/Behavioral:  Negative for depression and substance abuse. The patient has insomnia. The patient is not nervous/anxious.         Objective    BP 132/84 (BP Location: Left Arm, Patient Position: Sitting)   Pulse 82   Temp 97.8 F (36.6 C) (Temporal)   Ht 5\' 5"  (1.651 m)   Wt 150 lb (68 kg)   SpO2 100%   BMI 24.96 kg/m   Physical Exam Constitutional:      General: She is not in acute distress.    Appearance: She is not ill-appearing.  HENT:     Mouth/Throat:     Mouth: Mucous membranes are moist.  Eyes:     Extraocular Movements: Extraocular movements intact.     Conjunctiva/sclera: Conjunctivae normal.  Cardiovascular:     Rate and Rhythm: Normal rate and regular rhythm.  Pulmonary:     Effort: Pulmonary effort is normal.     Breath sounds: Normal breath sounds.  Musculoskeletal:     Cervical back: Normal range of motion and neck supple.     Right lower leg: No edema.     Left lower leg: No edema.  Skin:    General: Skin is warm and dry.  Neurological:     General: No focal deficit present.     Mental Status: She is alert and oriented to person, place, and time.     Motor: No weakness.     Coordination: Coordination normal.     Gait: Gait normal.  Psychiatric:        Mood and Affect: Mood normal.        Behavior: Behavior normal.        Thought Content: Thought content normal.         Assessment & Plan:   Problem List Items Addressed This Visit     Elevated LDL cholesterol level   Insomnia - Primary   Relevant Medications   traZODone  (DESYREL ) 50 MG tablet   Prediabetes   Vitamin D   deficiency   Other Visit Diagnoses       Encounter to establish care          She is a pleasant 78 year old female who is new to the practice here to establish care. Her main concern today is difficulty sleeping.  This has been ongoing.  She has read books about insomnia.  States she has tried medications in the past but unclear which ones.  She would like to try medication again.  Start trazodone . Declines labs.  Declines preventative health care at this time. Information provided regarding prediabetes eating plan and cholesterol. Follow-up in 6 weeks to discuss insomnia and discuss chronic health conditions again at that time.  Return in about 6 weeks (around 04/29/2024) for chronic health conditions.  Alyson Back, NP-C

## 2024-03-18 NOTE — Patient Instructions (Signed)
 Trazodone Tablets What is this medication? TRAZODONE (TRAZ oh done) treats depression. It increases the amount of serotonin in the brain, a hormone that helps regulate mood. This medicine may be used for other purposes; ask your health care provider or pharmacist if you have questions. COMMON BRAND NAME(S): Desyrel What should I tell my care team before I take this medication? They need to know if you have any of these conditions: Bipolar disorder Bleeding disorder Glaucoma Heart disease, or previous heart attack Irregular heartbeat or rhythm Kidney disease Liver disease Low levels of sodium in the blood Suicidal thoughts, plans, or attempt by you or a family member An unusual or allergic reaction to trazodone, other medications, foods, dyes, or preservatives Pregnant or trying to get pregnant Breastfeeding How should I use this medication? Take this medication by mouth with a glass of water. Take it as directed on the prescription label at the same time every day. Take this medication shortly after a meal or a light snack. Keep taking this medication unless your care team tells you to stop. Stopping it too quickly can cause serious side effects. It can also make your condition worse. A special MedGuide will be given to you by the pharmacist with each prescription and refill. Be sure to read this information carefully each time. Talk to your care team about the use of this medication in children. Special care may be needed. Overdosage: If you think you have taken too much of this medicine contact a poison control center or emergency room at once. NOTE: This medicine is only for you. Do not share this medicine with others. What if I miss a dose? If you miss a dose, take it as soon as you can. If it is almost time for your next dose, take only that dose. Do not take double or extra doses. What may interact with this medication? Do not take this medication with any of the following: Certain  medications for fungal infections, such as fluconazole, itraconazole, ketoconazole, posaconazole, voriconazole Cisapride Dronedarone Linezolid MAOIs, such as Carbex, Eldepryl, Marplan, Nardil, and Parnate Mesoridazine Methylene blue (injected into a vein) Pimozide Saquinavir Thioridazine This medication may also interact with the following: Alcohol Antiviral medications for HIV or AIDS Aspirin and aspirin-like medications Barbiturates, such as phenobarbital Certain medications for blood pressure, heart disease, irregular heart beat Certain medications for mental health conditions Certain medications for migraine headache, such as almotriptan, eletriptan, frovatriptan, naratriptan, rizatriptan, sumatriptan, zolmitriptan Certain medications for seizures, such as carbamazepine and phenytoin Certain medications for sleep Certain medications that treat or prevent blood clots, such as dalteparin, enoxaparin, warfarin Digoxin Fentanyl Lithium NSAIDS, medications for pain and inflammation, such as ibuprofen or naproxen Other medications that cause heart rhythm changes Rasagiline Supplements, such as St. John's wort, kava kava, valerian Tramadol Tryptophan This list may not describe all possible interactions. Give your health care provider a list of all the medicines, herbs, non-prescription drugs, or dietary supplements you use. Also tell them if you smoke, drink alcohol, or use illegal drugs. Some items may interact with your medicine. What should I watch for while using this medication? Visit your care team for regular checks on your progress. Tell your care team if your symptoms do not start to get better or if they get worse. Because it may take several weeks to see the full effects of this medication, it is important to continue your treatment as prescribed by your care team. Watch for new or worsening thoughts of suicide or depression. This  includes sudden changes in mood, behaviors,  or thoughts. These changes can happen at any time but are more common in the beginning of treatment or after a change in dose. Call your care team right away if you experience these thoughts or worsening depression. This medication may cause mood and behavior changes, such as anxiety, nervousness, irritability, hostility, restlessness, excitability, hyperactivity, or trouble sleeping. These changes can happen at any time but are more common in the beginning of treatment or after a change in dose. Call your care team right away if you notice any of these symptoms. This medication may affect your coordination, reaction time, or judgment. Do not drive or operate machinery until you know how this medication affects you. Sit up or stand slowly to reduce the risk of dizzy or fainting spells. Drinking alcohol with this medication can increase the risk of these side effects. This medication may cause dry eyes and blurred vision. If you wear contact lenses you may feel some discomfort. Lubricating drops may help. See your care team if the problem does not go away or is severe. Your mouth may get dry. Chewing sugarless gum or sucking hard candy and drinking plenty of water may help. Contact your care team if the problem does not go away or is severe. What side effects may I notice from receiving this medication? Side effects that you should report to your care team as soon as possible: Allergic reactions--skin rash, itching, hives, swelling of the face, lips, tongue, or throat Bleeding--bloody or black, tar-like stools, red or dark brown urine, vomiting blood or brown material that looks like coffee grounds, small, red or purple spots on skin, unusual bleeding or bruising Heart rhythm changes--fast or irregular heartbeat, dizziness, feeling faint or lightheaded, chest pain, trouble breathing Low blood pressure--dizziness, feeling faint or lightheaded, blurry vision Low sodium level--muscle weakness, fatigue,  dizziness, headache, confusion Prolonged or painful erection Serotonin syndrome--irritability, confusion, fast or irregular heartbeat, muscle stiffness, twitching muscles, sweating, high fever, seizures, chills, vomiting, diarrhea Sudden eye pain or change in vision such as blurry vision, seeing halos around lights, vision loss Thoughts of suicide or self-harm, worsening mood, feelings of depression Side effects that usually do not require medical attention (report to your care team if they continue or are bothersome): Change in sex drive or performance Constipation Dizziness Drowsiness Dry mouth This list may not describe all possible side effects. Call your doctor for medical advice about side effects. You may report side effects to FDA at 1-800-FDA-1088. Where should I keep my medication? Keep out of the reach of children and pets. Store at room temperature between 15 and 30 degrees C (59 to 86 degrees F). Protect from light. Keep container tightly closed. Throw away any unused medication after the expiration date. NOTE: This sheet is a summary. It may not cover all possible information. If you have questions about this medicine, talk to your doctor, pharmacist, or health care provider.  2024 Elsevier/Gold Standard (2022-11-09 00:00:00)

## 2024-03-24 ENCOUNTER — Telehealth: Payer: Self-pay | Admitting: Family Medicine

## 2024-03-24 NOTE — Telephone Encounter (Signed)
 Ok for letter

## 2024-03-24 NOTE — Telephone Encounter (Signed)
 Copied from CRM [id number]. Topic: Clinical - Medication Question >> Mar 24, 2024  8:49 AM Destiny Robbins wrote: Reason for CRM: Patient looking to start an exercise group and she is needing a letter stating that she is allowed to do it. Please reach out as soon as possible

## 2024-03-25 NOTE — Telephone Encounter (Signed)
 Called pt and advised of PCP response. Letter created and printed, placed up front for pick up.

## 2024-05-13 ENCOUNTER — Other Ambulatory Visit: Payer: Self-pay | Admitting: Family Medicine

## 2024-05-13 DIAGNOSIS — G4700 Insomnia, unspecified: Secondary | ICD-10-CM

## 2024-05-16 ENCOUNTER — Ambulatory Visit: Admitting: Family Medicine

## 2024-05-20 ENCOUNTER — Ambulatory Visit: Admitting: Family Medicine

## 2024-05-27 DIAGNOSIS — K080 Exfoliation of teeth due to systemic causes: Secondary | ICD-10-CM | POA: Diagnosis not present

## 2024-05-29 ENCOUNTER — Encounter: Payer: Self-pay | Admitting: Family Medicine

## 2024-05-29 ENCOUNTER — Ambulatory Visit (INDEPENDENT_AMBULATORY_CARE_PROVIDER_SITE_OTHER): Admitting: Family Medicine

## 2024-05-29 VITALS — BP 132/72 | HR 90 | Temp 97.6°F | Ht 65.0 in | Wt 144.0 lb

## 2024-05-29 DIAGNOSIS — R7303 Prediabetes: Secondary | ICD-10-CM | POA: Diagnosis not present

## 2024-05-29 DIAGNOSIS — F419 Anxiety disorder, unspecified: Secondary | ICD-10-CM

## 2024-05-29 DIAGNOSIS — F341 Dysthymic disorder: Secondary | ICD-10-CM | POA: Diagnosis not present

## 2024-05-29 DIAGNOSIS — E7800 Pure hypercholesterolemia, unspecified: Secondary | ICD-10-CM

## 2024-05-29 DIAGNOSIS — R232 Flushing: Secondary | ICD-10-CM

## 2024-05-29 DIAGNOSIS — F5101 Primary insomnia: Secondary | ICD-10-CM | POA: Diagnosis not present

## 2024-05-29 DIAGNOSIS — G4700 Insomnia, unspecified: Secondary | ICD-10-CM

## 2024-05-29 DIAGNOSIS — E559 Vitamin D deficiency, unspecified: Secondary | ICD-10-CM | POA: Diagnosis not present

## 2024-05-29 MED ORDER — TRAZODONE HCL 50 MG PO TABS: 50.0000 mg | ORAL_TABLET | Freq: Every day | ORAL | 1 refills | Status: DC

## 2024-05-29 NOTE — Progress Notes (Signed)
 Subjective:     Patient ID: Destiny Robbins, female    DOB: [birth year], 78 y.o.   MRN: [id number]  Chief Complaint  Patient presents with   Medical Management of Chronic Issues    6 week f/u    HPI  History of Present Illness         Here for follow up on chronic health conditions   She stopped soda.   She is doing yoga 7 days/wk  Stopped HRT at age 54 due to increased risk for breast cancer.   She is having hot flashes and would like to start on estrogen again.  States she has several hot flashes during the day and wakes up several times at night.   She denies hx of cancer. She does not get mammograms or bone density anymore.   C/o anxiety and stress. She is contemplating seeing a therapist  Declines medication.  Depression my entire life. Not any worse      05/29/2024   10:41 AM 03/18/2024    9:12 AM 07/19/2023    1:42 PM 01/10/2021    9:33 AM 11/17/2019    3:10 PM  Depression screen PHQ 2/9  Decreased Interest 0 0 0 2 0  Down, Depressed, Hopeless 0 0 0 2 0  PHQ - 2 Score 0 0 0 4 0  Altered sleeping    3   Tired, decreased energy    3   Change in appetite    3   Feeling bad or failure about yourself     1   Trouble concentrating    0   Moving slowly or fidgety/restless    0   Suicidal thoughts    0   PHQ-9 Score    14        Health Maintenance Due  Topic Date Due   Medicare Annual Wellness (AWV)  11/16/2020    Past Medical History:  Diagnosis Date   Anxiety    Cataract 2023   Chicken pox    Chronic daily headache 10/28/2014   Colon polyp    Depression, recurrent (HCC) 10/28/2014   History of chest x-ray    History of mammogram    Hyperglycemia 01/19/2019   UTI (urinary tract infection)    Vitamin D  deficiency 01/19/2019    Past Surgical History:  Procedure Laterality Date   AUGMENTATION MAMMAPLASTY     had impants removed and lift done   BREAST ENHANCEMENT SURGERY     BREAST IMPLANT REMOVAL     BUNIONECTOMY Right    EXCISION  MORTON'S NEUROMA Right    TOTAL ABDOMINAL HYSTERECTOMY  2001   abnormal bleeding/spotting. hx of abnormal pap. released from further pap testing    Family History  Problem Relation Age of Onset   Other Mother        bowel obstruction   Healthy Father     Social History   Socioeconomic History   Marital status: Divorced    Spouse name: Not on file   Number of children: 2   Years of education: 12    Highest education level: High school graduate  Occupational History   Occupation: retired   Tobacco Use   Smoking status: Never   Smokeless tobacco: Never  Substance and Sexual Activity   Alcohol use: Yes    Comment: about 1-2 drinks yearly   Drug use: No   Sexual activity: Not on file  Other Topics Concern   Not on file  Social History  Narrative   Work or School: retiredHome Situation: lives with daughterSpiritual Beliefs: Non-denominational, christianLifestyle: starting to exercise; healthy dietHCPOA: daughter Annahi Short [phone]; has living will       Tobacco use, amount per day now: No   Past tobacco use, amount per day: No   How many years did you use tobacco: No   Alcohol use (drinks per week): No   Diet: Normal    Do you drink/eat things with caffeine: Yes   Marital status:   Divorced                               What year were you married? 1970   Do you live in a house, apartment, assisted living, condo, trailer, etc.? House    Is it one or more stories? One   How many persons live in your home? Self    Do you have pets in your home?( please list) No   Highest Level of education completed? 12th grade.   Current or past profession: Many    Do you exercise?     Sometimes                             Type and how often? Walk, and Yoga   Do you have a living will? Yes   Do you have a DNR form? Yes                                  If not, do you want to discuss one?   Do you have signed POA/HPOA forms?   Yes                     If so, please bring to you  appointment      Do you have any difficulty bathing or dressing yourself? No   Do you have any difficulty preparing food or eating? No   Do you have any difficulty managing your medications? No   Do you have any difficulty managing your finances? No   Do you have any difficulty affording your medications? No   Social Drivers of Corporate investment banker Strain: Low Risk  (11/17/2019)   Overall Financial Resource Strain (CARDIA)    Difficulty of Paying Living Expenses: Not hard at all  Food Insecurity: No Food Insecurity (11/17/2019)   Hunger Vital Sign    Worried About Running Out of Food in the Last Year: Never true    Ran Out of Food in the Last Year: Never true  Transportation Needs: Not on file  Physical Activity: Not on file  Stress: Not on file  Social Connections: Not on file  Intimate Partner Violence: Not on file    Outpatient Medications Prior to Visit  Medication Sig Dispense Refill   traZODone  (DESYREL ) 50 MG tablet TAKE 1 TABLET BY MOUTH AT BEDTIME 30 tablet 0   No facility-administered medications prior to visit.    Allergies  Allergen Reactions   Vicodin [Hydrocodone-Acetaminophen ] Rash    Review of Systems  Constitutional:  Positive for weight loss. Negative for chills, fever and malaise/fatigue.       Intentional  HENT:  Negative for congestion and sore throat.   Eyes:  Negative for blurred vision, double vision and photophobia.  Respiratory:  Negative for cough and shortness  of breath.   Cardiovascular:  Negative for chest pain, palpitations and leg swelling.  Gastrointestinal:  Negative for abdominal pain, constipation, diarrhea, nausea and vomiting.  Genitourinary:  Negative for dysuria, frequency and urgency.  Musculoskeletal:  Negative for falls and joint pain.  Skin:  Negative for rash.  Neurological:  Negative for dizziness, focal weakness and headaches.  Psychiatric/Behavioral:  Positive for depression. Negative for substance abuse and  suicidal ideas. The patient is nervous/anxious and has insomnia.        Objective:    Physical Exam Constitutional:      General: She is not in acute distress.    Appearance: She is not ill-appearing.  HENT:     Mouth/Throat:     Mouth: Mucous membranes are moist.     Pharynx: Oropharynx is clear.  Eyes:     Extraocular Movements: Extraocular movements intact.     Conjunctiva/sclera: Conjunctivae normal.  Cardiovascular:     Rate and Rhythm: Normal rate and regular rhythm.  Pulmonary:     Effort: Pulmonary effort is normal.     Breath sounds: Normal breath sounds.  Musculoskeletal:     Cervical back: Normal range of motion and neck supple.     Right lower leg: No edema.     Left lower leg: No edema.  Skin:    General: Skin is warm and dry.  Neurological:     General: No focal deficit present.     Mental Status: She is alert and oriented to person, place, and time.     Cranial Nerves: No cranial nerve deficit.     Motor: No weakness.     Coordination: Coordination normal.     Gait: Gait normal.  Psychiatric:        Mood and Affect: Mood normal.        Behavior: Behavior normal.        Thought Content: Thought content normal.      BP 132/72   Pulse 90   Temp 97.6 F (36.4 C) (Temporal)   Ht 5' 5 (1.651 m)   Wt 144 lb (65.3 kg)   SpO2 99%   BMI 23.96 kg/m  Wt Readings from Last 3 Encounters:  05/29/24 144 lb (65.3 kg)  03/18/24 150 lb (68 kg)  07/19/23 146 lb 12.8 oz (66.6 kg)       Assessment & Plan:   Problem List Items Addressed This Visit     Anxiety   Consider therapy. Make time for self care. Follow up if worsening       Relevant Medications   traZODone  (DESYREL ) 50 MG tablet   Other Relevant Orders   TSH (Completed)   T4, free (Completed)   Magnesium (Completed)   Elevated LDL cholesterol level   The 10-year ASCVD risk score (Arnett DK, et al., 2019) is: 16.8%   Values used to calculate the score:     Age: 32 years     Clincally  relevant sex: Female     Is Non-Hispanic African American: No     Diabetic: No     Tobacco smoker: No     Systolic Blood Pressure: 132 mmHg     Is BP treated: No     HDL Cholesterol: 49.9 mg/dL     Total Cholesterol: 184 mg/dL  Not on statin therapy. Recommend statin. I will see if she is willing to start Crestor or Lipitor based on elevated ASCVD and LDL 109       Relevant Orders  Lipid panel (Completed)   Hot flashes   Discussed avoiding triggers.  She has tried over-the-counter medications.  She has been on HRT in the past.  She would consider this again but I discussed that I would recommend a referral to endocrinology or gynecology due to increased risks at her age.  She would like to hold off for now.  Check labs to look for underlying etiology      Relevant Orders   CBC with Differential/Platelet (Completed)   Comprehensive metabolic panel with GFR (Completed)   TSH (Completed)   T4, free (Completed)   Magnesium (Completed)   Ferritin (Completed)   Folate (Completed)   Vitamin B12 (Completed)   Insomnia   Continue trazodone       Relevant Medications   traZODone  (DESYREL ) 50 MG tablet   Other Relevant Orders   TSH (Completed)   T4, free (Completed)   Magnesium (Completed)   Ferritin (Completed)   Folate (Completed)   Vitamin B12 (Completed)   Persistent depressive disorder   Unchanged per patient.  Recommend therapy.  Declines medication.  Follow-up if worsening or to discuss starting medication      Relevant Medications   traZODone  (DESYREL ) 50 MG tablet   Prediabetes - Primary   Check A1c and follow-up.  Recommend low sugar, low carbohydrate diet and increasing physical activity.      Relevant Orders   CBC with Differential/Platelet (Completed)   Comprehensive metabolic panel with GFR (Completed)   Hemoglobin A1c (Completed)   Vitamin D  deficiency   Check vitamin D  level and replace as needed      Relevant Orders   VITAMIN D  25 Hydroxy (Vit-D  Deficiency, Fractures) (Completed)    I have changed Homer F. Newsham's traZODone .  Meds ordered this encounter  Medications   traZODone  (DESYREL ) 50 MG tablet    Sig: Take 1 tablet (50 mg total) by mouth at bedtime.    Dispense:  90 tablet    Refill:  1

## 2024-05-29 NOTE — Patient Instructions (Signed)
 Please return next week fasting (nothing to eat or drink except water for at least 8 hours).  Drink 1 to 2 glasses of water before getting your labs checked.  Continue trazodone  for now  Try taking over-the-counter magnesium every night.  Call and schedule with a therapist  I will be in touch with your results

## 2024-06-02 ENCOUNTER — Ambulatory Visit: Payer: Self-pay | Admitting: Family Medicine

## 2024-06-02 LAB — CBC WITH DIFFERENTIAL/PLATELET
Basophils Absolute: 0 K/uL (ref 0.0–0.1)
Basophils Relative: 0.4 % (ref 0.0–3.0)
Eosinophils Absolute: 0.2 K/uL (ref 0.0–0.7)
Eosinophils Relative: 3.1 % (ref 0.0–5.0)
HCT: 41 % (ref 36.0–46.0)
Hemoglobin: 13.6 g/dL (ref 12.0–15.0)
Lymphocytes Relative: 41.3 % (ref 12.0–46.0)
Lymphs Abs: 2.9 K/uL (ref 0.7–4.0)
MCHC: 33.2 g/dL (ref 30.0–36.0)
MCV: 91.3 fl (ref 78.0–100.0)
Monocytes Absolute: 0.4 K/uL (ref 0.1–1.0)
Monocytes Relative: 5.4 % (ref 3.0–12.0)
Neutro Abs: 3.5 K/uL (ref 1.4–7.7)
Neutrophils Relative %: 49.8 % (ref 43.0–77.0)
Platelets: 250 K/uL (ref 150.0–400.0)
RBC: 4.49 Mil/uL (ref 3.87–5.11)
RDW: 13.3 % (ref 11.5–15.5)
WBC: 7 K/uL (ref 4.0–10.5)

## 2024-06-02 LAB — VITAMIN D 25 HYDROXY (VIT D DEFICIENCY, FRACTURES): VITD: 30.23 ng/mL (ref 30.00–100.00)

## 2024-06-02 LAB — LIPID PANEL
Cholesterol: 184 mg/dL (ref 0–200)
HDL: 49.9 mg/dL (ref >39.00)
LDL Cholesterol: 109 mg/dL — ABNORMAL HIGH (ref 0–99)
NonHDL: 134.07
Total CHOL/HDL Ratio: 4
Triglycerides: 124 mg/dL (ref 0.0–149.0)
VLDL: 24.8 mg/dL (ref 0.0–40.0)

## 2024-06-02 LAB — VITAMIN B12: Vitamin B-12: 775 pg/mL (ref 211–911)

## 2024-06-02 LAB — COMPREHENSIVE METABOLIC PANEL WITH GFR
ALT: 13 U/L (ref 0–35)
AST: 18 U/L (ref 0–37)
Albumin: 4.3 g/dL (ref 3.5–5.2)
Alkaline Phosphatase: 57 U/L (ref 39–117)
BUN: 16 mg/dL (ref 6–23)
CO2: 27 meq/L (ref 19–32)
Calcium: 9.4 mg/dL (ref 8.4–10.5)
Chloride: 101 meq/L (ref 96–112)
Creatinine, Ser: 0.91 mg/dL (ref 0.40–1.20)
GFR: 61.63 mL/min (ref >60.00)
Glucose, Bld: 97 mg/dL (ref 70–99)
Potassium: 4 meq/L (ref 3.5–5.1)
Sodium: 137 meq/L (ref 135–145)
Total Bilirubin: 0.6 mg/dL (ref 0.2–1.2)
Total Protein: 7.4 g/dL (ref 6.0–8.3)

## 2024-06-02 LAB — TSH: TSH: 1.9 u[IU]/mL (ref 0.35–5.50)

## 2024-06-02 LAB — FOLATE: Folate: 14.3 ng/mL (ref >5.9)

## 2024-06-02 LAB — FERRITIN: Ferritin: 35.6 ng/mL (ref 10.0–291.0)

## 2024-06-02 LAB — MAGNESIUM: Magnesium: 1.9 mg/dL (ref 1.5–2.5)

## 2024-06-02 LAB — T4, FREE: Free T4: 0.86 ng/dL (ref 0.60–1.60)

## 2024-06-02 LAB — HEMOGLOBIN A1C: Hgb A1c MFr Bld: 6.3 % (ref 4.6–6.5)

## 2024-06-02 NOTE — Assessment & Plan Note (Signed)
Check vitamin D level and replace as needed.  

## 2024-06-02 NOTE — Assessment & Plan Note (Signed)
 Check A1c and follow-up.  Recommend low sugar, low carbohydrate diet and increasing physical activity.

## 2024-06-02 NOTE — Assessment & Plan Note (Signed)
 Consider therapy. Make time for self care. Follow up if worsening

## 2024-06-02 NOTE — Assessment & Plan Note (Signed)
 Discussed avoiding triggers.  She has tried over-the-counter medications.  She has been on HRT in the past.  She would consider this again but I discussed that I would recommend a referral to endocrinology or gynecology due to increased risks at her age.  She would like to hold off for now.  Check labs to look for underlying etiology

## 2024-06-02 NOTE — Assessment & Plan Note (Signed)
 Unchanged per patient.  Recommend therapy.  Declines medication.  Follow-up if worsening or to discuss starting medication

## 2024-06-02 NOTE — Assessment & Plan Note (Addendum)
 The 10-year ASCVD risk score (Arnett DK, et al., 2019) is: 16.8%   Values used to calculate the score:     Age: 78 years     Clincally relevant sex: Female     Is Non-Hispanic African American: No     Diabetic: No     Tobacco smoker: No     Systolic Blood Pressure: 132 mmHg     Is BP treated: No     HDL Cholesterol: 49.9 mg/dL     Total Cholesterol: 184 mg/dL  Not on statin therapy. Recommend statin. I will see if she is willing to start Crestor or Lipitor based on elevated ASCVD and LDL 109

## 2024-06-02 NOTE — Assessment & Plan Note (Signed)
 Continue trazodone

## 2024-06-02 NOTE — Progress Notes (Signed)
 Please let her know that her LDL (the bad cholesterol) is elevated and her overall 10-year risk of developing heart disease, having a heart attack or stroke is significantly elevated.  Has she ever been on a medication for cholesterol such as Crestor or Lipitor?  Is she willing to start taking one to help lower her cholesterol and her heart disease risk? If so, I will send this to her pharmacy and she should follow up with me 6 weeks after starting the medication.  Her labs are stable otherwise.  Vitamin D  is low normal so take vitamin D3 OTC 1,000 IUs daily. Blood sugars have been in prediabetes range at 6.3% for the past 3 months.  Continue cutting back on sugar and carbohydrates (potatoes, bread, pasta and rice).

## 2024-06-04 NOTE — Telephone Encounter (Signed)
 Copied from CRM [id number]. Topic: Clinical - Lab/Test Results >> Jun 03, 2024  4:59 PM Chasity T wrote: Reason for CRM: Patient is returning missed call to nurse regarding lab results. Please contact her to discuss

## 2024-12-09 ENCOUNTER — Other Ambulatory Visit: Payer: Self-pay | Admitting: Family Medicine

## 2024-12-09 DIAGNOSIS — G4700 Insomnia, unspecified: Secondary | ICD-10-CM
